# Patient Record
Sex: Male | Born: 1975 | Race: Black or African American | Hispanic: No | Marital: Single | Smoking: Current every day smoker
Health system: Southern US, Community
[De-identification: ages and names within clinical notes are randomized; demographics above are authoritative.]

---

## 1998-02-11 ENCOUNTER — Emergency Department (HOSPITAL_COMMUNITY): Admission: EM | Admit: 1998-02-11 | Discharge: 1998-02-11 | Payer: Self-pay | Admitting: Emergency Medicine

## 1999-02-08 ENCOUNTER — Emergency Department (HOSPITAL_COMMUNITY): Admission: EM | Admit: 1999-02-08 | Discharge: 1999-02-08 | Payer: Self-pay

## 1999-06-17 ENCOUNTER — Emergency Department (HOSPITAL_COMMUNITY): Admission: EM | Admit: 1999-06-17 | Discharge: 1999-06-17 | Payer: Self-pay | Admitting: Emergency Medicine

## 1999-06-25 ENCOUNTER — Emergency Department (HOSPITAL_COMMUNITY): Admission: EM | Admit: 1999-06-25 | Discharge: 1999-06-25 | Payer: Self-pay | Admitting: Emergency Medicine

## 1999-08-21 ENCOUNTER — Emergency Department (HOSPITAL_COMMUNITY): Admission: EM | Admit: 1999-08-21 | Discharge: 1999-08-21 | Payer: Self-pay | Admitting: Emergency Medicine

## 1999-10-10 ENCOUNTER — Emergency Department (HOSPITAL_COMMUNITY): Admission: EM | Admit: 1999-10-10 | Discharge: 1999-10-10 | Payer: Self-pay | Admitting: Emergency Medicine

## 2000-09-10 ENCOUNTER — Emergency Department (HOSPITAL_COMMUNITY): Admission: EM | Admit: 2000-09-10 | Discharge: 2000-09-11 | Payer: Self-pay | Admitting: Emergency Medicine

## 2001-02-14 ENCOUNTER — Emergency Department (HOSPITAL_COMMUNITY): Admission: EM | Admit: 2001-02-14 | Discharge: 2001-02-14 | Payer: Self-pay | Admitting: Emergency Medicine

## 2003-01-19 ENCOUNTER — Emergency Department (HOSPITAL_COMMUNITY): Admission: EM | Admit: 2003-01-19 | Discharge: 2003-01-20 | Payer: Self-pay | Admitting: Emergency Medicine

## 2003-01-20 ENCOUNTER — Encounter: Payer: Self-pay | Admitting: Emergency Medicine

## 2005-08-10 ENCOUNTER — Emergency Department (HOSPITAL_COMMUNITY): Admission: EM | Admit: 2005-08-10 | Discharge: 2005-08-10 | Payer: Self-pay | Admitting: Emergency Medicine

## 2006-03-18 ENCOUNTER — Emergency Department (HOSPITAL_COMMUNITY): Admission: EM | Admit: 2006-03-18 | Discharge: 2006-03-18 | Payer: Self-pay | Admitting: Emergency Medicine

## 2007-02-22 ENCOUNTER — Emergency Department (HOSPITAL_COMMUNITY): Admission: EM | Admit: 2007-02-22 | Discharge: 2007-02-22 | Payer: Self-pay | Admitting: Emergency Medicine

## 2007-03-10 ENCOUNTER — Emergency Department (HOSPITAL_COMMUNITY): Admission: EM | Admit: 2007-03-10 | Discharge: 2007-03-10 | Payer: Self-pay | Admitting: Emergency Medicine

## 2007-10-15 ENCOUNTER — Emergency Department (HOSPITAL_COMMUNITY): Admission: EM | Admit: 2007-10-15 | Discharge: 2007-10-15 | Payer: Self-pay | Admitting: Emergency Medicine

## 2008-05-16 ENCOUNTER — Emergency Department (HOSPITAL_COMMUNITY): Admission: EM | Admit: 2008-05-16 | Discharge: 2008-05-16 | Payer: Self-pay | Admitting: Emergency Medicine

## 2008-07-07 ENCOUNTER — Emergency Department (HOSPITAL_COMMUNITY): Admission: EM | Admit: 2008-07-07 | Discharge: 2008-07-07 | Payer: Self-pay | Admitting: Emergency Medicine

## 2008-08-15 ENCOUNTER — Emergency Department (HOSPITAL_COMMUNITY): Admission: EM | Admit: 2008-08-15 | Discharge: 2008-08-15 | Payer: Self-pay | Admitting: Emergency Medicine

## 2010-05-25 ENCOUNTER — Emergency Department (HOSPITAL_COMMUNITY): Admission: EM | Admit: 2010-05-25 | Discharge: 2010-05-25 | Payer: Self-pay | Admitting: Emergency Medicine

## 2010-05-31 ENCOUNTER — Emergency Department (HOSPITAL_COMMUNITY): Admission: EM | Admit: 2010-05-31 | Discharge: 2010-05-31 | Payer: Self-pay | Admitting: Emergency Medicine

## 2010-08-14 ENCOUNTER — Emergency Department (HOSPITAL_COMMUNITY)
Admission: EM | Admit: 2010-08-14 | Discharge: 2010-08-14 | Payer: Self-pay | Source: Home / Self Care | Admitting: Emergency Medicine

## 2010-11-15 ENCOUNTER — Emergency Department (HOSPITAL_COMMUNITY)
Admission: EM | Admit: 2010-11-15 | Discharge: 2010-11-15 | Disposition: A | Discharge status: Home / Self Care | Payer: Self-pay | Attending: Emergency Medicine | Admitting: Emergency Medicine

## 2010-11-15 ENCOUNTER — Emergency Department (HOSPITAL_COMMUNITY): Payer: Self-pay

## 2010-11-15 DIAGNOSIS — M25539 Pain in unspecified wrist: Secondary | ICD-10-CM | POA: Insufficient documentation

## 2011-11-25 ENCOUNTER — Emergency Department (HOSPITAL_COMMUNITY)
Admission: EM | Admit: 2011-11-25 | Discharge: 2011-11-25 | Disposition: A | Discharge status: Home / Self Care | Payer: Self-pay | Attending: Emergency Medicine | Admitting: Emergency Medicine

## 2011-11-25 ENCOUNTER — Encounter (HOSPITAL_COMMUNITY): Payer: Self-pay | Admitting: Emergency Medicine

## 2011-11-25 DIAGNOSIS — F17200 Nicotine dependence, unspecified, uncomplicated: Secondary | ICD-10-CM | POA: Insufficient documentation

## 2011-11-25 DIAGNOSIS — K029 Dental caries, unspecified: Secondary | ICD-10-CM | POA: Insufficient documentation

## 2011-11-25 MED ORDER — HYDROCODONE-ACETAMINOPHEN 5-325 MG PO TABS: 1.0000 | ORAL_TABLET | Freq: Four times a day (QID) | ORAL | Status: AC | PRN

## 2011-11-25 MED ORDER — NAPROXEN 500 MG PO TABS: 500.0000 mg | ORAL_TABLET | Freq: Two times a day (BID) | ORAL | Status: DC

## 2011-11-25 MED ORDER — PENICILLIN V POTASSIUM 500 MG PO TABS: 500.0000 mg | ORAL_TABLET | Freq: Three times a day (TID) | ORAL | Status: AC

## 2011-11-25 NOTE — ED Notes (Signed)
Onset one week ago dental pain worsening of over time. Pain currently 8/10 throbbing. Ax4

## 2011-11-25 NOTE — ED Provider Notes (Signed)
History     CSN: [id number]  Arrival date & time 11/25/11  1359   First MD Initiated Contact with Patient 11/25/11 1623      Chief Complaint  Patient presents with  . Dental Pain    (Consider location/radiation/quality/duration/timing/severity/associated sxs/prior treatment) Patient is a 36 y.o. male presenting with tooth pain. The history is provided by the patient.  Dental PainThe primary symptoms include mouth pain. Primary symptoms do not include dental injury, fever, shortness of breath or sore throat. Episode onset: Several days ago. The symptoms are worsening.  Additional symptoms do not include: purulent gums, trismus, facial swelling and drooling.    History reviewed. No pertinent past medical history.  History reviewed. No pertinent past surgical history.  No family history on file.  History  Substance Use Topics  . Smoking status: Current Some Day Smoker  . Smokeless tobacco: Not on file  . Alcohol Use: No      Review of Systems  Constitutional: Negative for fever.  HENT: Negative for sore throat, facial swelling and drooling.   Respiratory: Negative for shortness of breath.   All other systems reviewed and are negative.    Allergies  Review of patient's allergies indicates no known allergies.  Home Medications  No current outpatient prescriptions on file.  BP 116/90  Pulse 80  Temp(Src) 98.5 F (36.9 C) (Oral)  Resp 16  SpO2 99%  Physical Exam  Nursing note and vitals reviewed. Constitutional: He appears well-developed and well-nourished. No distress.  HENT:  Head: Normocephalic and atraumatic. No trismus in the jaw.  Right Ear: External ear normal.  Left Ear: External ear normal.  Mouth/Throat: Abnormal dentition. Dental caries present. No dental abscesses or uvula swelling.  Eyes: Conjunctivae are normal. Right eye exhibits no discharge. Left eye exhibits no discharge. No scleral icterus.  Neck: Neck supple. No tracheal deviation present.   Cardiovascular: Normal rate.   Pulmonary/Chest: Effort normal. No stridor. No respiratory distress.  Musculoskeletal: He exhibits no edema.  Neurological: He is alert. Cranial nerve deficit: no gross deficits.  Skin: Skin is warm and dry. No rash noted.  Psychiatric: He has a normal mood and affect.    ED Course  Procedures (including critical care time)  Labs Reviewed - No data to display No results found.   1. Dental caries       MDM  Symptoms are consistent with a dental caries possible tooth infection. There are no obvious signs of a severe abscess. He has no significant facial swelling. At this time there does not appear to be any evidence of an emergency medical condition. I will refer him to a dentist/oral surgeon. Patient will be given prescriptions  for pain medications and antibiotics.         Celene Kras, MD 11/25/11 [phone]

## 2011-11-25 NOTE — Discharge Instructions (Signed)
Dental Care and Dentist Visits   Dental care supports good overall health. Regular dental visits can also help you avoid dental pain, bleeding, infection, and other more serious health problems in the future. It is important to keep the mouth healthy because diseases in the teeth, gums, and other oral tissues can spread to other areas of the body. Some problems, such as diabetes, heart disease, and pre-term labor have been associated with poor oral health.   See your dentist every 6 months. If you experience emergency problems such as a toothache or broken tooth, go to the dentist right away. If you see your dentist regularly, you may catch problems early. It is easier to be treated for problems in the early stages.   WHAT TO EXPECT AT A DENTIST VISIT   Your dentist will look for many common oral health problems and recommend proper treatment. At your regular dental visit, you can expect:   Gentle cleaning of the teeth and gums. This includes scraping and polishing. This helps to remove the sticky substance around the teeth and gums (plaque). Plaque forms in the mouth shortly after eating. Over time, plaque hardens on the teeth as tartar. If tartar is not removed regularly, it can cause problems. Cleaning also helps remove stains.   Periodic X-rays. These pictures of the teeth and supporting bone will help your dentist assess the health of your teeth.   Periodic fluoride treatments. Fluoride is a natural mineral shown to help strengthen teeth. Fluoride treatment involves applying a fluoride gel or varnish to the teeth. It is most commonly done in children.   Examination of the mouth, tongue, jaws, teeth, and gums to look for any oral health problems, such as:   Cavities (dental caries). This is decay on the tooth caused by plaque, sugar, and acid in the mouth. It is best to catch a cavity when it is small.   Inflammation of the gums caused by plaque buildup (gingivitis).   Problems with the mouth or malformed or  misaligned teeth.   Oral cancer or other diseases of the soft tissues or jaws.   KEEP YOUR TEETH AND GUMS HEALTHY   For healthy teeth and gums, follow these general guidelines as well as your dentist's specific advice:   Have your teeth professionally cleaned at the dentist every 6 months.   Brush twice daily with a fluoride toothpaste.   Floss your teeth daily.   Ask your dentist if you need fluoride supplements, treatments, or fluoride toothpaste.   Eat a healthy diet. Reduce foods and drinks with added sugar.   Avoid smoking.  TREATMENT FOR ORAL HEALTH PROBLEMS   If you have oral health problems, treatment varies depending on the conditions present in your teeth and gums.   Your caregiver will most likely recommend good oral hygiene at each visit.   For cavities, gingivitis, or other oral health disease, your caregiver will perform a procedure to treat the problem. This is typically done at a separate appointment. Sometimes your caregiver will refer you to another dental specialist for specific tooth problems or for surgery.  SEEK IMMEDIATE DENTAL CARE IF:   You have pain, bleeding, or soreness in the gum, tooth, jaw, or mouth area.   A permanent tooth becomes loose or separated from the gum socket.   You experience a blow or injury to the mouth or jaw area.  Document Released: 05/15/2011 Document Revised: 08/22/2011 Document Reviewed: 05/15/2011   [employer] Patient Information 2012 [employer].

## 2012-02-07 ENCOUNTER — Encounter (HOSPITAL_COMMUNITY): Payer: Self-pay | Admitting: *Deleted

## 2012-02-07 ENCOUNTER — Emergency Department (HOSPITAL_COMMUNITY)
Admission: EM | Admit: 2012-02-07 | Discharge: 2012-02-07 | Disposition: A | Discharge status: Home Health | Payer: Self-pay | Attending: Emergency Medicine | Admitting: Emergency Medicine

## 2012-02-07 DIAGNOSIS — K029 Dental caries, unspecified: Secondary | ICD-10-CM | POA: Insufficient documentation

## 2012-02-07 DIAGNOSIS — F17200 Nicotine dependence, unspecified, uncomplicated: Secondary | ICD-10-CM | POA: Insufficient documentation

## 2012-02-07 DIAGNOSIS — K0889 Other specified disorders of teeth and supporting structures: Secondary | ICD-10-CM

## 2012-02-07 MED ORDER — PENICILLIN V POTASSIUM 500 MG PO TABS: 500.0000 mg | ORAL_TABLET | Freq: Three times a day (TID) | ORAL | Status: AC

## 2012-02-07 MED ORDER — IBUPROFEN 800 MG PO TABS: 800.0000 mg | ORAL_TABLET | Freq: Three times a day (TID) | ORAL | Status: AC

## 2012-02-07 MED ORDER — ACETAMINOPHEN-CODEINE #3 300-30 MG PO TABS
1.0000 | ORAL_TABLET | Freq: Four times a day (QID) | ORAL | Status: AC | PRN
Start: 2012-02-07 — End: 2012-02-17

## 2012-02-07 NOTE — ED Notes (Signed)
Patient complains of pain in the upper left back side of his mouth x 1 week.  He has noted broken teeth in area he complains of pain.

## 2012-02-07 NOTE — ED Provider Notes (Signed)
History     CSN: [id number]  Arrival date & time 02/07/12  0744   First MD Initiated Contact with Patient 02/07/12 0805      Chief Complaint  Patient presents with  . Dental Pain    (Consider location/radiation/quality/duration/timing/severity/associated sxs/prior treatment) Patient is a 36 y.o. male presenting with tooth pain. The history is provided by the patient.  Dental Pain  Patient presents to the ER complaining of a 1 week hx or recurrent left upper dental decay associated with massive dental decay. Patient states he was seen in ER a few months ago for similar pain and took medications with relief of pain but did not follow up with dentist for "financial reasons." he denies fevers, chills, HA, facial swelling, difficulty breathing or swallowing. States pain is aggravated by chewing and cold air. Has not taken anything for pain PTA.   History reviewed. No pertinent past medical history.  History reviewed. No pertinent past surgical history.  No family history on file.  History  Substance Use Topics  . Smoking status: Current Everyday Smoker  . Smokeless tobacco: Not on file  . Alcohol Use: No      Review of Systems  All other systems reviewed and are negative.    Allergies  Review of patient's allergies indicates no known allergies.  Home Medications   Current Outpatient Rx  Name Route Sig Dispense Refill  . IBUPROFEN 200 MG PO TABS Oral Take 600 mg by mouth every 6 (six) hours as needed. For pain    . ACETAMINOPHEN-CODEINE #3 300-30 MG PO TABS Oral Take 1-2 tablets by mouth every 6 (six) hours as needed for pain. 15 tablet 0  . IBUPROFEN 800 MG PO TABS Oral Take 1 tablet (800 mg total) by mouth 3 (three) times daily. Take 800mg  by mouth at breakfast, lunch and dinner for the next 5 days 21 tablet 0  . PENICILLIN V POTASSIUM 500 MG PO TABS Oral Take 1 tablet (500 mg total) by mouth 3 (three) times daily. 30 tablet 0    BP 134/97  Pulse 88  Temp(Src) 97.7  F (36.5 C) (Oral)  Resp 18  SpO2 100%  Physical Exam  Nursing note and vitals reviewed. Constitutional: He is oriented to person, place, and time. He appears well-developed and well-nourished. No distress.  HENT:  Head: Normocephalic and atraumatic.  Right Ear: External ear normal.  Left Ear: External ear normal.  Mouth/Throat: Oropharynx is clear and moist.       Patent airway without uvula, lingual or sublingual edema.   Massive dental decay of left upper molar with TTP and gingivitis but no gingival mass or fluctuance.   Eyes: Conjunctivae are normal.  Neck: Normal range of motion. Neck supple.  Cardiovascular: Normal rate.   Pulmonary/Chest: Effort normal.  Musculoskeletal: Normal range of motion.  Lymphadenopathy:    He has no cervical adenopathy.  Neurological: He is alert and oriented to person, place, and time.  Skin: Skin is warm and dry. No rash noted. He is not diaphoretic. No erythema.  Psychiatric: He has a normal mood and affect.    ED Course  Procedures (including critical care time)  Labs Reviewed - No data to display No results found.   1. Dental decay   2. Pain, dental       MDM  Left upper dental decay with associated dental pain but no signs or symptoms of dental abscess. Patent airway. Afebrile, non toxic appearing.   Dental referral given.  [place], [place] 02/07/12 [phone]

## 2012-02-07 NOTE — ED Provider Notes (Signed)
Medical screening examination/treatment/procedure(s) were performed by non-physician practitioner and as supervising physician I was immediately available for consultation/collaboration.   Carleene Cooper III, MD 02/07/12 2035

## 2012-02-07 NOTE — Discharge Instructions (Signed)
Apply warm compresses to jaw throughout the day. Take antibiotic and completion. Alternate between ibuprofen and tylenol#3 as directed, as needed for pain but do not drive or operate machinery with tylenol#3 use. Followup with a dentist is very important for ongoing evaluation and management of recurrent dental pain. Call office today and explain that you are being referred from ER for dental pain and decay follow up. However return to emergency department for emergent changing or worsening symptoms.   Dental Caries  Tooth decay (dental caries, cavities) is the most common of all oral diseases. It occurs in all ages but is more common in children and young adults.  CAUSES  Bacteria in your mouth combine with foods (particularly sugars and starches) to produce plaque. Plaque is a substance that sticks to the hard surfaces of teeth. The bacteria in the plaque produce acids that attack the enamel of teeth. Repeated acid attacks dissolve the enamel and create holes in the teeth. Root surfaces of teeth may also get these holes.  Other contributing factors include:   Frequent snacking and drinking of cavity-producing foods and liquids.   Poor oral hygiene.   Dry mouth.   Substance abuse such as methamphetamine.   Broken or poor fitting dental restorations.   Eating disorders.   Gastroesophageal reflux disease (GERD).   Certain radiation treatments to the head and neck.  SYMPTOMS  At first, dental decay appears as white, chalky areas on the enamel. In this early stage, symptoms are seldom present. As the decay progresses, pits and holes may appear on the enamel surfaces. Progression of the decay will lead to softening of the hard layers of the tooth. At this point you may experience some pain or achy feeling after sweet, hot, or cold foods or drinks are consumed. If left untreated, the decay will reach the internal structures of the tooth and produce severe pain. Extensive dental treatment, such as  root canal therapy, may be needed to save the tooth at this late stage of decay development.  DIAGNOSIS  Most cavities will be detected during regular check-ups. A thorough medical and dental history will be taken by the dentist. The dentist will use instruments to check the surfaces of your teeth for any breakdown or discoloration. Some dentists have special instruments, such as lasers, that detect tooth decay. Dental X-rays may also show some cavities that are not visible to the eye (such as between the contact areas of the teeth). TREATMENT  Treatment involves removal of the tooth decay and replacement with a restorative material such as silver, gold, or composite (white) material. However, if the decay involves a large area of the tooth and there is little remaining healthy tooth structure, a cap (crown) will be fitted over the remaining structure. If the decay involves the center part of the tooth (pulp), root canal treatment will be needed before any type of dental restoration is placed. If the tooth is severely destroyed by the decay process, leaving the remaining tooth structures unrestorable, the tooth will need to be pulled (extracted). Some early tooth decay may be reversed by fluoride treatments and thorough brushing and flossing at home. PREVENTION   Eat healthy foods. Restrict the amount of sugary, starchy foods and liquids you consume. Avoid frequent snacking and drinking of unhealthy foods and liquids.   Sealants can help with prevention of cavities. Sealants are composite resins applied onto the biting surfaces of teeth at risk for decay. They smooth out the pits and grooves and prevent food  from being trapped in them. This is done in early childhood before tooth decay has started.   Fluoride tablets may also be prescribed to children between 6 months and 69 years of age if your drinking water is not fluoridated. The fluoride absorbed by the tooth enamel makes teeth less susceptible to  decay. Thorough daily cleaning with a toothbrush and dental floss is the best way to prevent cavities. Use of a fluoride toothpaste is highly recommended. Fluoride mouth rinses may be used in specific cases.   Topical application of fluoride by your dentist is important in children.   Regular visits with a dentist for checkups and cleanings are also important.  SEEK IMMEDIATE DENTAL CARE IF:  You have a fever.   You develop redness and swelling of your face, jaw, or neck.   You develop swelling around a tooth.   You are unable to open your mouth or cannot swallow.   You have severe pain uncontrolled by pain medicine.  Document Released: 05/25/2002 Document Revised: 08/22/2011 Document Reviewed: 02/07/2011 [facility] Patient Information 2012 [place], [place].

## 2012-06-16 ENCOUNTER — Encounter (HOSPITAL_COMMUNITY): Payer: Self-pay | Admitting: *Deleted

## 2012-06-16 ENCOUNTER — Emergency Department (HOSPITAL_COMMUNITY)
Admission: EM | Admit: 2012-06-16 | Discharge: 2012-06-16 | Disposition: A | Discharge status: Home / Self Care | Payer: Self-pay | Attending: Emergency Medicine | Admitting: Emergency Medicine

## 2012-06-16 DIAGNOSIS — F17200 Nicotine dependence, unspecified, uncomplicated: Secondary | ICD-10-CM | POA: Insufficient documentation

## 2012-06-16 DIAGNOSIS — K0889 Other specified disorders of teeth and supporting structures: Secondary | ICD-10-CM

## 2012-06-16 DIAGNOSIS — K089 Disorder of teeth and supporting structures, unspecified: Secondary | ICD-10-CM | POA: Insufficient documentation

## 2012-06-16 MED ORDER — PENICILLIN V POTASSIUM 250 MG PO TABS: 250.0000 mg | ORAL_TABLET | Freq: Four times a day (QID) | ORAL | Status: AC

## 2012-06-16 MED ORDER — HYDROCODONE-ACETAMINOPHEN 5-325 MG PO TABS
1.0000 | ORAL_TABLET | Freq: Once | ORAL | Status: AC
  Administered 2012-06-16: 1 via ORAL
  Filled 2012-06-16: qty 1

## 2012-06-16 MED ORDER — HYDROCODONE-ACETAMINOPHEN 5-325 MG PO TABS: 1.0000 | ORAL_TABLET | ORAL | Status: DC | PRN

## 2012-06-16 NOTE — ED Notes (Signed)
Patient presents with a cracked left lower molar. States this has been on going for the last three days. He has only been taking oralgel and tylenol. He has not seen a dentist. Denies fevers or chills.

## 2012-06-16 NOTE — ED Provider Notes (Signed)
History  Scribed for No att. providers found, the patient was seen in room TR07C/TR07C. This chart was scribed by Candelaria Stagers. The patient's care started at 8:05 PM   CSN: [id number]  Arrival date & time 06/16/12  1759   First MD Initiated Contact with Patient 06/16/12 1815      Chief Complaint  Patient presents with  . Dental Pain     The history is provided by the patient. No language interpreter was used.   Blake Bishop is a 36 y.o. male who presents to the Emergency Department complaining of recurrent left lower tooth pain that became worse three days ago.  Pt denies fever or chills.  Nothing seems to make the sx better or worse.  Pt states that he is going to have the tooth pulled in three days.     History reviewed. No pertinent past medical history.  History reviewed. No pertinent past surgical history.  No family history on file.  History  Substance Use Topics  . Smoking status: Current Every Day Smoker  . Smokeless tobacco: Not on file  . Alcohol Use: No      Review of Systems  Constitutional: Negative for fever and chills.  HENT: Positive for dental problem.   All other systems reviewed and are negative.    Allergies  Review of patient's allergies indicates no known allergies.  Home Medications   Current Outpatient Rx  Name Route Sig Dispense Refill  . IBUPROFEN 200 MG PO TABS Oral Take 600 mg by mouth every 6 (six) hours as needed. For pain    . HYDROCODONE-ACETAMINOPHEN 5-325 MG PO TABS Oral Take 1 tablet by mouth every 4 (four) hours as needed for pain. 15 tablet 0  . PENICILLIN V POTASSIUM 250 MG PO TABS Oral Take 1 tablet (250 mg total) by mouth 4 (four) times daily. 40 tablet 0    BP 120/95  Pulse 100  Temp 97.4 F (36.3 C) (Oral)  Resp 18  SpO2 98%  Physical Exam  Nursing note and vitals reviewed. Constitutional: He is oriented to person, place, and time. He appears well-developed and well-nourished. No distress.  HENT:  Head:  Normocephalic and atraumatic.       First molar on the left lower side is fractured.  Tender to pressure.  No abscesses.  Multiple other fractured teeth.   Eyes: EOM are normal. Pupils are equal, round, and reactive to light.  Neck: Neck supple. No tracheal deviation present.  Pulmonary/Chest: Effort normal. No respiratory distress.  Musculoskeletal: Normal range of motion. He exhibits no edema.  Neurological: He is alert and oriented to person, place, and time.  Skin: Skin is warm and dry.  Psychiatric: He has a normal mood and affect. His behavior is normal.    ED Course  Procedures   DIAGNOSTIC STUDIES: Oxygen Saturation is 98% on room air, normal by my interpretation.    COORDINATION OF CARE:    Labs Reviewed - No data to display No results found.   1. Pain, dental       MDM  I personally performed the services described in this documentation, which was scribed in my presence. The recorded information has been reviewed and considered.        Loren Racer, MD 06/16/12 2005

## 2012-06-16 NOTE — ED Notes (Signed)
The pt has had a toothache for 3 days 

## 2012-11-13 ENCOUNTER — Emergency Department (HOSPITAL_COMMUNITY)
Admission: EM | Admit: 2012-11-13 | Discharge: 2012-11-13 | Disposition: A | Discharge status: Home / Self Care | Payer: Self-pay | Attending: Emergency Medicine | Admitting: Emergency Medicine

## 2012-11-13 ENCOUNTER — Encounter (HOSPITAL_COMMUNITY): Payer: Self-pay | Admitting: Emergency Medicine

## 2012-11-13 DIAGNOSIS — K089 Disorder of teeth and supporting structures, unspecified: Secondary | ICD-10-CM | POA: Insufficient documentation

## 2012-11-13 DIAGNOSIS — F17200 Nicotine dependence, unspecified, uncomplicated: Secondary | ICD-10-CM | POA: Insufficient documentation

## 2012-11-13 DIAGNOSIS — K0889 Other specified disorders of teeth and supporting structures: Secondary | ICD-10-CM

## 2012-11-13 MED ORDER — PENICILLIN V POTASSIUM 500 MG PO TABS: 500.0000 mg | ORAL_TABLET | Freq: Three times a day (TID) | ORAL | Status: DC

## 2012-11-13 MED ORDER — OXYCODONE-ACETAMINOPHEN 5-325 MG PO TABS
2.0000 | ORAL_TABLET | Freq: Once | ORAL | Status: AC
  Administered 2012-11-13: 2 via ORAL
  Filled 2012-11-13: qty 2

## 2012-11-13 MED ORDER — HYDROCODONE-ACETAMINOPHEN 5-325 MG PO TABS: 2.0000 | ORAL_TABLET | Freq: Four times a day (QID) | ORAL | Status: DC | PRN

## 2012-11-13 NOTE — ED Notes (Signed)
Left tooth pain x 1 week. Mild swelling noted. States has had a broken tooth "for a while".

## 2012-11-13 NOTE — ED Provider Notes (Signed)
History    This chart was scribed for non-physician practitioner Roxy Horseman, PA-C working with Nelia Shi, MD by Toya Smothers, ED Scribe. This patient was seen in room TR04C/TR04C and the patient's care was started at 15:51.  CSN: [id number]  Arrival date & time 11/13/12  1518   First MD Initiated Contact with Patient 11/13/12 1528      No chief complaint on file.  The history is provided by the patient. No language interpreter was used.    Blake Bishop is a 37 y.o. male who presents to the Emergency Department complaining of 1 week of gradual onset, progressive, moderate, left maxillary dental pain without injury. Pain is aching, worse with pressure, and alleviated by nothing. Pt reports that he was unable to see his Dentist today, though he intends to follow up Monday. Symptoms have not been treated PTA. No fever, chills, difficulty swallowing, or difficulty breathing. Pt is a current everyday smoker, denying alcohol and illicit drug use. No pertinent medical or surgical Hx denoted.   History reviewed. No pertinent past medical history.  History reviewed. No pertinent past surgical history.  No family history on file.  History  Substance Use Topics  . Smoking status: Current Every Day Smoker  . Smokeless tobacco: Not on file  . Alcohol Use: No      Review of Systems  HENT: Positive for dental problem.   All other systems reviewed and are negative.    Allergies  Review of patient's allergies indicates no known allergies.  Home Medications   Current Outpatient Rx  Name  Route  Sig  Dispense  Refill  . HYDROcodone-acetaminophen (NORCO/VICODIN) 5-325 MG per tablet   Oral   Take 2 tablets by mouth every 6 (six) hours as needed for pain.   15 tablet   0   . penicillin v potassium (VEETID) 500 MG tablet   Oral   Take 1 tablet (500 mg total) by mouth 3 (three) times daily.   30 tablet   0     BP 151/102  Pulse 92  Temp(Src) 98.5 F (36.9 C) (Oral)   SpO2 99%  Physical Exam  Nursing note and vitals reviewed. Constitutional: He is oriented to person, place, and time. He appears well-developed and well-nourished. No distress.  HENT:  Head: Atraumatic. Macrocephalic.  Mouth/Throat: Lacerations present.    Poor dentition throughout. Top left 3 molars are broken and decayed. No signs of gingival swelling. No signs of peritonsillar or tonsillar swelling. Uvula is midline. No ludwig angular.   Eyes: EOM are normal.  Neck: Neck supple. No tracheal deviation present.  Cardiovascular: Normal rate.   Pulmonary/Chest: Effort normal. No respiratory distress.  Musculoskeletal: Normal range of motion.  Neurological: He is alert and oriented to person, place, and time.  Skin: Skin is warm and dry.  Psychiatric: He has a normal mood and affect. His behavior is normal.    ED Course  Dental Date/Time: 11/13/2012 2:00 PM Performed by: Roxy Horseman Authorized by: Roxy Horseman Consent: Verbal consent obtained. Risks and benefits: risks, benefits and alternatives were discussed Consent given by: patient Patient understanding: patient states understanding of the procedure being performed Patient consent: the patient's understanding of the procedure matches consent given Patient identity confirmed: verbally with patient Local anesthesia used: yes Local anesthetic: bupivacaine 0.5% with epinephrine Patient sedated: no Patient tolerance: Patient tolerated the procedure well with no immediate complications. Comments: Alveolar block   DIAGNOSTIC STUDIES: Oxygen Saturation is 99% on room air, normal  by my interpretation.    COORDINATION OF CARE: 15:51- Evaluated Pt. Pt is awake, alert, and without distress. 15:52- Preformed Alveolar Block.  15:58- Patient informed of clinical course, understand medical decision-making process, and agree with plan.  16:00- Ordered oxyCODONE-acetaminophen (PERCOCET/ROXICET) 5-325 MG per tablet 2 tablet  Once. Plan: Home Medications- penicillin v potassium (VEETID) 500 MG tablet  3 times daily and HYDROcodone-acetaminophen (NORCO/VICODIN) 5-325 MG per tablet every  Six hours PRN. ; Recommended follow up- Dentist     Labs Reviewed - No data to display No results found.   1. Pain, dental       MDM  37 year old male with uncomplicated dental pain.  Patient treated with dental block in the ED.  Will discharge with dental follow up, pain meds, and abx.     I personally performed the services described in this documentation, which was scribed in my presence. The recorded information has been reviewed and is accurate.     Roxy Horseman, PA-C 11/13/12 1622

## 2012-11-15 NOTE — ED Provider Notes (Signed)
Medical screening examination/treatment/procedure(s) were performed by non-physician practitioner and as supervising physician I was immediately available for consultation/collaboration.    Nelia Shi, MD 11/15/12 [phone]

## 2012-11-27 ENCOUNTER — Encounter (HOSPITAL_COMMUNITY): Payer: Self-pay | Admitting: Emergency Medicine

## 2012-11-27 ENCOUNTER — Emergency Department (HOSPITAL_COMMUNITY)
Admission: EM | Admit: 2012-11-27 | Discharge: 2012-11-27 | Disposition: A | Discharge status: Home / Self Care | Payer: Self-pay | Attending: Emergency Medicine | Admitting: Emergency Medicine

## 2012-11-27 DIAGNOSIS — K0889 Other specified disorders of teeth and supporting structures: Secondary | ICD-10-CM

## 2012-11-27 DIAGNOSIS — K089 Disorder of teeth and supporting structures, unspecified: Secondary | ICD-10-CM | POA: Insufficient documentation

## 2012-11-27 DIAGNOSIS — F17200 Nicotine dependence, unspecified, uncomplicated: Secondary | ICD-10-CM | POA: Insufficient documentation

## 2012-11-27 MED ORDER — HYDROCODONE-ACETAMINOPHEN 5-325 MG PO TABS: 2.0000 | ORAL_TABLET | Freq: Four times a day (QID) | ORAL | Status: DC | PRN

## 2012-11-27 MED ORDER — PENICILLIN V POTASSIUM 500 MG PO TABS: 500.0000 mg | ORAL_TABLET | Freq: Four times a day (QID) | ORAL | Status: AC

## 2012-11-27 NOTE — ED Notes (Signed)
AIDET performed. 

## 2012-11-27 NOTE — ED Provider Notes (Signed)
History     CSN: [id number]  Arrival date & time 11/27/12  4098   First MD Initiated Contact with Patient 11/27/12 [phone]      Chief Complaint  Patient presents with  . Dental Pain    (Consider location/radiation/quality/duration/timing/severity/associated sxs/prior treatment) HPI Comments: Patient presenting with dental pain.  He does not have a dentist.  He has taken Tylenol for the pain without relief.  Patient is a 37 y.o. male presenting with tooth pain. The history is provided by the patient.  Dental PainThe primary symptoms include mouth pain. Primary symptoms do not include dental injury or fever. The symptoms began 2 days ago. The symptoms are worsening. The symptoms are new. The symptoms occur constantly.  Additional symptoms include: gum swelling and gum tenderness. Additional symptoms do not include: dental sensitivity to temperature, purulent gums, trismus, facial swelling, trouble swallowing, pain with swallowing and drooling.    History reviewed. No pertinent past medical history.  History reviewed. No pertinent past surgical history.  No family history on file.  History  Substance Use Topics  . Smoking status: Current Every Day Smoker -- 0.25 packs/day    Types: Cigarettes  . Smokeless tobacco: Not on file  . Alcohol Use: No      Review of Systems  Constitutional: Negative for fever and chills.  HENT: Positive for dental problem. Negative for facial swelling, drooling, trouble swallowing, neck pain and neck stiffness.   All other systems reviewed and are negative.    Allergies  Review of patient's allergies indicates no known allergies.  Home Medications   Current Outpatient Rx  Name  Route  Sig  Dispense  Refill  . acetaminophen (TYLENOL) 325 MG tablet   Oral   Take 325-650 mg by mouth every 6 (six) hours as needed for pain.           BP 135/91  Pulse 100  Temp(Src) 97.2 F (36.2 C) (Oral)  Ht 5\' 7"  (1.702 m)  Wt 135 lb (61.236 kg)  BMI  21.14 kg/m2  SpO2 100%  Physical Exam  Nursing note and vitals reviewed. Constitutional: He is oriented to person, place, and time. He appears well-developed and well-nourished. No distress.  HENT:  Head: Normocephalic and atraumatic. No trismus in the jaw.  Mouth/Throat: Uvula is midline, oropharynx is clear and moist and mucous membranes are normal. Abnormal dentition. No dental abscesses or edematous. No oropharyngeal exudate, posterior oropharyngeal edema, posterior oropharyngeal erythema or tonsillar abscesses.  Poor dental hygiene. Pt able to open and close mouth with out difficulty. Airway intact. Uvula midline. Mild gingival swelling with tenderness over affected area, but no fluctuance. No swelling or tenderness of submental and submandibular regions.  Eyes: Conjunctivae and EOM are normal.  Neck: Normal range of motion and full passive range of motion without pain. Neck supple.  Cardiovascular: Normal rate and regular rhythm.   Pulmonary/Chest: Effort normal and breath sounds normal. No stridor. No respiratory distress. He has no wheezes.  Musculoskeletal: Normal range of motion.  Lymphadenopathy:       Head (right side): No submental, no submandibular, no tonsillar, no preauricular and no posterior auricular adenopathy present.       Head (left side): No submental, no submandibular, no tonsillar, no preauricular and no posterior auricular adenopathy present.    He has no cervical adenopathy.  Neurological: He is alert and oriented to person, place, and time.  Skin: Skin is warm and dry. No rash noted. He is not diaphoretic.  ED Course  Procedures (including critical care time)  Labs Reviewed - No data to display No results found.   No diagnosis found.    MDM  Patient with toothache.  No gross abscess.  Exam unconcerning for Ludwig's angina or spread of infection.  Will treat with penicillin and pain medicine.  Urged patient to follow-up with dentist.           Pascal Lux [place], PA-C 11/28/12 1936

## 2012-11-27 NOTE — ED Notes (Signed)
Patient states started having "wisdom tooth pain x 2 days ago".

## 2012-11-29 NOTE — ED Provider Notes (Signed)
Medical screening examination/treatment/procedure(s) were performed by non-physician practitioner and as supervising physician I was immediately available for consultation/collaboration.  Susie Ehresman L Rylie Knierim, MD 11/29/12 0855 

## 2012-12-09 ENCOUNTER — Encounter (HOSPITAL_COMMUNITY): Payer: Self-pay

## 2012-12-09 ENCOUNTER — Emergency Department (HOSPITAL_COMMUNITY)
Admission: EM | Admit: 2012-12-09 | Discharge: 2012-12-09 | Disposition: A | Discharge status: Home / Self Care | Payer: Self-pay | Attending: Emergency Medicine | Admitting: Emergency Medicine

## 2012-12-09 DIAGNOSIS — F17200 Nicotine dependence, unspecified, uncomplicated: Secondary | ICD-10-CM | POA: Insufficient documentation

## 2012-12-09 DIAGNOSIS — K029 Dental caries, unspecified: Secondary | ICD-10-CM | POA: Insufficient documentation

## 2012-12-09 DIAGNOSIS — K047 Periapical abscess without sinus: Secondary | ICD-10-CM | POA: Insufficient documentation

## 2012-12-09 MED ORDER — IBUPROFEN 800 MG PO TABS: 800.0000 mg | ORAL_TABLET | Freq: Three times a day (TID) | ORAL | Status: DC

## 2012-12-09 MED ORDER — HYDROCODONE-ACETAMINOPHEN 7.5-325 MG/15ML PO SOLN: 10.0000 mL | Freq: Three times a day (TID) | ORAL | Status: DC | PRN

## 2012-12-09 MED ORDER — PENICILLIN V POTASSIUM 500 MG PO TABS: 500.0000 mg | ORAL_TABLET | Freq: Four times a day (QID) | ORAL | Status: AC

## 2012-12-09 NOTE — ED Notes (Signed)
Patient present with c/o right lower dental pain and right ear pain x 2 days. States that it's his wisdom tooth. Hurts to chew on the affected side. No jaw pain. No sensitivity to hot or cold.

## 2012-12-09 NOTE — ED Provider Notes (Signed)
History     CSN: [id number]  Arrival date & time 12/09/12  0157   First MD Initiated Contact with Patient 12/09/12 0202      Chief Complaint  Patient presents with  . Dental Pain    (Consider location/radiation/quality/duration/timing/severity/associated sxs/prior treatment) HPI History by patient. Left lower dental pain onset a few days ago. History of same. Does not have a dentist. He is a smoker. No trauma. Pain sharp in quality and moderate to severe. Pain is not radiating. Hurts to chew. No known alleviating factors. No fevers or chills. No nausea vomiting. No facial swelling.  History reviewed. No pertinent past medical history.  History reviewed. No pertinent past surgical history.  No family history on file.  History  Substance Use Topics  . Smoking status: Current Every Day Smoker -- 0.25 packs/day    Types: Cigarettes  . Smokeless tobacco: Never Used  . Alcohol Use: No      Review of Systems  Constitutional: Negative for fever and chills.  HENT: Positive for dental problem. Negative for sore throat, trouble swallowing, neck pain and neck stiffness.   Eyes: Negative for pain.  Respiratory: Negative for shortness of breath.   Cardiovascular: Negative for chest pain.  Gastrointestinal: Negative for abdominal pain.  Genitourinary: Negative for dysuria.  Musculoskeletal: Negative for back pain.  Skin: Negative for rash.  Neurological: Negative for headaches.  All other systems reviewed and are negative.    Allergies  Review of patient's allergies indicates no known allergies.  Home Medications   Current Outpatient Rx  Name  Route  Sig  Dispense  Refill  . acetaminophen (TYLENOL) 325 MG tablet   Oral   Take 325-650 mg by mouth every 6 (six) hours as needed for pain.         Marland Kitchen HYDROcodone-acetaminophen (NORCO/VICODIN) 5-325 MG per tablet   Oral   Take 2 tablets by mouth every 6 (six) hours as needed for pain.   20 tablet   0     BP 109/69   Pulse 85  Temp(Src) 98.1 F (36.7 C) (Oral)  SpO2 100%  Physical Exam  Constitutional: He is oriented to person, place, and time. He appears well-developed and well-nourished.  HENT:  Head: Normocephalic and atraumatic.  No trismus. Left lower third molar dental caries and tenderness to palpation. No gingival swelling or fluctuance. No associated facial swelling or erythema. Uvula is midline.  Eyes: EOM are normal. Pupils are equal, round, and reactive to light.  Neck: Neck supple.  Cardiovascular: Regular rhythm and intact distal pulses.   Pulmonary/Chest: Effort normal. No stridor. No respiratory distress.  Musculoskeletal: Normal range of motion. He exhibits no edema.  Lymphadenopathy:    He has no cervical adenopathy.  Neurological: He is alert and oriented to person, place, and time.  Skin: Skin is warm and dry.    ED Course  Dental Date/Time: 12/09/2012 2:36 AM Performed by: Sunnie Nielsen Authorized by: Sunnie Nielsen Consent: Verbal consent obtained. Risks and benefits: risks, benefits and alternatives were discussed Consent given by: patient Patient understanding: patient states understanding of the procedure being performed Patient consent: the patient's understanding of the procedure matches consent given Procedure consent: procedure consent matches procedure scheduled Required items: required blood products, implants, devices, and special equipment available Patient identity confirmed: verbally with patient Time out: Immediately prior to procedure a "time out" was called to verify the correct patient, procedure, equipment, support staff and site/side marked as required. Preparation: Patient was prepped and draped in  the usual sterile fashion. Local anesthesia used: yes Anesthesia: local infiltration Local anesthetic: bupivacaine 0.5% without epinephrine Anesthetic total: 1.8 ml Patient tolerance: Patient tolerated the procedure well with no immediate  complications. Comments: Left lower third molar dental block using 27-gauge needle - good anesthesia achieved   (including critical care time)    MDM  Dental pain. Dental block.  Dental referral  Prescription for antibiotics and pain medications provided  Dental infection precautions provided and verbalizes understood        Sunnie Nielsen, MD 12/09/12 [phone]

## 2014-07-18 ENCOUNTER — Emergency Department (HOSPITAL_COMMUNITY)
Admission: EM | Admit: 2014-07-18 | Discharge: 2014-07-18 | Disposition: A | Discharge status: Home / Self Care | Payer: Self-pay | Attending: Emergency Medicine | Admitting: Emergency Medicine

## 2014-07-18 ENCOUNTER — Encounter (HOSPITAL_COMMUNITY): Payer: Self-pay | Admitting: Emergency Medicine

## 2014-07-18 DIAGNOSIS — Z791 Long term (current) use of non-steroidal anti-inflammatories (NSAID): Secondary | ICD-10-CM | POA: Insufficient documentation

## 2014-07-18 DIAGNOSIS — M549 Dorsalgia, unspecified: Secondary | ICD-10-CM

## 2014-07-18 DIAGNOSIS — M546 Pain in thoracic spine: Secondary | ICD-10-CM | POA: Insufficient documentation

## 2014-07-18 DIAGNOSIS — Z720 Tobacco use: Secondary | ICD-10-CM | POA: Insufficient documentation

## 2014-07-18 MED ORDER — OXYCODONE-ACETAMINOPHEN 5-325 MG PO TABS
1.0000 | ORAL_TABLET | Freq: Once | ORAL | Status: AC
  Administered 2014-07-18: 1 via ORAL
  Filled 2014-07-18: qty 1

## 2014-07-18 MED ORDER — METHOCARBAMOL 500 MG PO TABS: 500.0000 mg | ORAL_TABLET | Freq: Two times a day (BID) | ORAL | Status: DC

## 2014-07-18 MED ORDER — HYDROCODONE-ACETAMINOPHEN 5-325 MG PO TABS: 1.0000 | ORAL_TABLET | ORAL | Status: DC | PRN

## 2014-07-18 MED ORDER — METHOCARBAMOL 500 MG PO TABS
500.0000 mg | ORAL_TABLET | Freq: Once | ORAL | Status: AC
  Administered 2014-07-18: 500 mg via ORAL
  Filled 2014-07-18: qty 1

## 2014-07-18 NOTE — Discharge Instructions (Signed)
Take the prescribed medication as directed. °Follow-up with your primary care physician. °Return to the ED for new or worsening symptoms. ° °

## 2014-07-18 NOTE — ED Provider Notes (Signed)
CSN: [id number]     Arrival date & time 07/18/14  1421 History  This chart was scribed for non-physician practitioner Sharilyn SitesLisa Liahm Grivas working with Shon Batonourtney F Horton, MD by Littie Deedsichard Sun, ED Scribe. This patient was seen in room WTR7/WTR7 and the patient's care was started at 2:46 PM.        Chief Complaint  Patient presents with  . Back Pain      The history is provided by the patient. No language interpreter was used.    HPI Comments: Blake Bishop is a 38 y.o. male brought in by EMS who presents to the Emergency Department complaining of gradually worsening, constant left-sided back pain that worsened earlier today. He notes having some numbness and tingling in his left arm. Patient has had similar back pain in the past for which he normally takes Ibuprofen; however, he states that he has never had pain this severe before. The pain feels worse when sitting a certain way. Patient has tried taking Goody powder today but without relief; he did not take any Ibuprofen today. He denies any injury or falls. 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 NKDA.  No chest pain or SOB.   History reviewed. No pertinent past medical history. History reviewed. No pertinent past surgical history. No family history on file. History  Substance Use Topics  . Smoking status: Current Every Day Smoker -- 0.25 packs/day    Types: Cigarettes  . Smokeless tobacco: Never Used  . Alcohol Use: No    Review of Systems  Musculoskeletal: Positive for back pain.  All other systems reviewed and are negative.     Allergies  Review of patient's allergies indicates no known allergies.  Home Medications   Prior to Admission medications   Medication Sig Start Date End Date Taking? Authorizing Provider  acetaminophen (TYLENOL) 325 MG tablet Take 325-650 mg by mouth every 6 (six) hours as needed for pain.    Historical Provider, MD   HYDROcodone-acetaminophen (HYCET) 7.5-325 mg/15 ml solution Take 10 mLs by mouth every 8 (eight) hours as needed for pain. 12/09/12   Sunnie NielsenBrian Opitz, MD  HYDROcodone-acetaminophen (NORCO/VICODIN) 5-325 MG per tablet Take 2 tablets by mouth every 6 (six) hours as needed for pain. 11/27/12   Heather Laisure, PA-C  ibuprofen (ADVIL,MOTRIN) 800 MG tablet Take 1 tablet (800 mg total) by mouth 3 (three) times daily. 12/09/12   Sunnie NielsenBrian Opitz, MD   BP 156/99 mmHg  Pulse 86  Temp(Src) 98.1 F (36.7 C) (Oral)  Resp 18  SpO2 100%   Physical Exam  Constitutional: He is oriented to person, place, and time. He appears well-developed and well-nourished. No distress.  HENT:  Head: Normocephalic and atraumatic.  Mouth/Throat: Oropharynx is clear and moist. No oropharyngeal exudate.  Eyes: Pupils are equal, round, and reactive to light.  Neck: Normal range of motion. Neck supple.  Cardiovascular: Normal rate.   Pulmonary/Chest: Effort normal and breath sounds normal. No respiratory distress. He has no wheezes.  Musculoskeletal: He exhibits no edema.       Thoracic back: He exhibits tenderness and pain.       Back:  Left thoracic paraspinal tenderness without deformity; no mid-line tenderness or step-off; full ROM maintained; pain reproducible with palpation and movement of left shoulder; normal strength and sensation of BUE; both arms remain NVI  Neurological: He is alert and oriented to person, place, and time. No cranial nerve deficit.  AAOx3,  answering questions appropriately; equal strength UE and LE bilaterally; CN grossly intact; moves all extremities appropriately without ataxia; no focal neuro deficits or facial asymmetry appreciated  Skin: Skin is warm and dry. No rash noted. He is not diaphoretic.  Psychiatric: He has a normal mood and affect. His behavior is normal.  Nursing note and vitals reviewed.   ED Course  Procedures  DIAGNOSTIC STUDIES: Oxygen Saturation is 100% on room air, normal by  my interpretation.    COORDINATION OF CARE: 2:50 PM-Discussed treatment plan which includes medication with pt at bedside and pt agreed to plan.    Labs Review Labs Reviewed - No data to display  Imaging Review No results found.   EKG Interpretation None      MDM   Final diagnoses:  Back pain, unspecified location   38 year old male with left mid back pain. Patient works at [employer] and routinely lifts heavy objects on a daily basis. On exam, patient has left thoracic paraspinal tenderness without midline tenderness or deformities. Pain is reproducible with palpation and movement of left shoulder.  He notes subjective numbness and paresthesias of his left arm, however neurologic exam is nonfocal. No chest pain or shortness of breath to suggest PE, patient PERC negative.  Suspect muscular strain.  Patient will be discharged home with vicodin and robaxin. Encouraged FU with PCP. Discussed plan with patient, he/she acknowledged understanding and agreed with plan of care.  Return precautions given for new or worsening symptoms.  I personally performed the services described in this documentation, which was scribed in my presence. The recorded information has been reviewed and is accurate.  Garlon HatchetLisa M Nerine Pulse, PA-C 07/18/14 1547

## 2014-07-18 NOTE — ED Notes (Signed)
Per EMS pt comes from home c/o left side back pain that has been going on but gotten worse today. Pt works at [employer] where he [employer]. Pt took Goody powder today with no relief.

## 2014-07-25 NOTE — ED Notes (Signed)
Patient needed a return to work note-per Trixie DredgeEmily Pheonix Clinkscale, PA ok for RN to document "per patient, may return to normal work duties" on original note

## 2014-07-29 ENCOUNTER — Encounter (HOSPITAL_COMMUNITY): Payer: Self-pay | Admitting: Emergency Medicine

## 2014-07-29 ENCOUNTER — Emergency Department (HOSPITAL_COMMUNITY)
Admission: EM | Admit: 2014-07-29 | Discharge: 2014-07-29 | Disposition: A | Discharge status: Home / Self Care | Payer: Self-pay | Attending: Emergency Medicine | Admitting: Emergency Medicine

## 2014-07-29 DIAGNOSIS — M546 Pain in thoracic spine: Secondary | ICD-10-CM | POA: Insufficient documentation

## 2014-07-29 DIAGNOSIS — Z720 Tobacco use: Secondary | ICD-10-CM | POA: Insufficient documentation

## 2014-07-29 DIAGNOSIS — Z79899 Other long term (current) drug therapy: Secondary | ICD-10-CM | POA: Insufficient documentation

## 2014-07-29 MED ORDER — KETOROLAC TROMETHAMINE 60 MG/2ML IM SOLN
60.0000 mg | Freq: Once | INTRAMUSCULAR | Status: AC
  Administered 2014-07-29: 60 mg via INTRAMUSCULAR
  Filled 2014-07-29: qty 2

## 2014-07-29 NOTE — ED Notes (Signed)
Pt states that he was at work this morning and was picking something up when he started having right upper back pain again.  Pt states that he was seen here last week for back pain as well.

## 2014-07-29 NOTE — Discharge Instructions (Signed)
You will need to see the orthopedic doctor, or a medical doctor of your choice for evaluation and treatment of your recurrent back pain.  They mail it to refer you for physical therapy to improve your strength and allow you to do your regular job activities.  In the meantime, use heat on the sore area 3 or 4 times a day, and take ibuprofen 400 mg 3 times a day with meals for pain.     Back Pain, Adult Low back pain is very common. About 1 in 5 people have back pain.The cause of low back pain is rarely dangerous. The pain often gets better over time.About half of people with a sudden onset of back pain feel better in just 2 weeks. About 8 in 10 people feel better by 6 weeks.  CAUSES Some common causes of back pain include:  Strain of the muscles or ligaments supporting the spine.  Wear and tear (degeneration) of the spinal discs.  Arthritis.  Direct injury to the back. DIAGNOSIS Most of the time, the direct cause of low back pain is not known.However, back pain can be treated effectively even when the exact cause of the pain is unknown.Answering your caregiver's questions about your overall health and symptoms is one of the most accurate ways to make sure the cause of your pain is not dangerous. If your caregiver needs more information, he or she may order lab work or imaging tests (X-rays or MRIs).However, even if imaging tests show changes in your back, this usually does not require surgery. HOME CARE INSTRUCTIONS For many people, back pain returns.Since low back pain is rarely dangerous, it is often a condition that people can learn to [facility]on their own.   Remain active. It is stressful on the back to sit or stand in one place. Do not sit, drive, or stand in one place for more than 30 minutes at a time. Take short walks on level surfaces as soon as pain allows.Try to increase the length of time you walk each day.  Do not stay in bed.Resting more than 1 or 2 days can delay your  recovery.  Do not avoid exercise or work.Your body is made to move.It is not dangerous to be active, even though your back may hurt.Your back will likely heal faster if you return to being active before your pain is gone.  Pay attention to your body when you bend and lift. Many people have less discomfortwhen lifting if they bend their knees, keep the load close to their bodies,and avoid twisting. Often, the most comfortable positions are those that put less stress on your recovering back.  Find a comfortable position to sleep. Use a firm mattress and lie on your side with your knees slightly bent. If you lie on your back, put a pillow under your knees.  Only take over-the-counter or prescription medicines as directed by your caregiver. Over-the-counter medicines to reduce pain and inflammation are often the most helpful.Your caregiver may prescribe muscle relaxant drugs.These medicines help dull your pain so you can more quickly return to your normal activities and healthy exercise.  Put ice on the injured area.  Put ice in a plastic bag.  Place a towel between your skin and the bag.  Leave the ice on for 15-20 minutes, 03-04 times a day for the first 2 to 3 days. After that, ice and heat may be alternated to reduce pain and spasms.  Ask your caregiver about trying back exercises and gentle massage.  This may be of some benefit.  Avoid feeling anxious or stressed.Stress increases muscle tension and can worsen back pain.It is important to recognize when you are anxious or stressed and learn ways to manage it.Exercise is a great option. SEEK MEDICAL CARE IF:  You have pain that is not relieved with rest or medicine.  You have pain that does not improve in 1 week.  You have new symptoms.  You are generally not feeling well. SEEK IMMEDIATE MEDICAL CARE IF:   You have pain that radiates from your back into your legs.  You develop new bowel or bladder control problems.  You  have unusual weakness or numbness in your arms or legs.  You develop nausea or vomiting.  You develop abdominal pain.  You feel faint. Document Released: 09/02/2005 Document Revised: 03/03/2012 Document Reviewed: 01/04/2014 [facility] Patient Information 2015 [place], [place]. This information is not intended to replace advice given to you by your health care provider. Make sure you discuss any questions you have with your health care provider.  Back Exercises Back exercises help treat and prevent back injuries. The goal of back exercises is to increase the strength of your abdominal and back muscles and the flexibility of your back. These exercises should be started when you no longer have back pain. Back exercises include:  Pelvic Tilt. Lie on your back with your knees bent. Tilt your pelvis until the lower part of your back is against the floor. Hold this position 5 to 10 sec and repeat 5 to 10 times.  Knee to Chest. Pull first 1 knee up against your chest and hold for 20 to 30 seconds, repeat this with the other knee, and then both knees. This may be done with the other leg straight or bent, whichever feels better.  Sit-Ups or Curl-Ups. Bend your knees 90 degrees. Start with tilting your pelvis, and do a partial, slow sit-up, lifting your trunk only 30 to 45 degrees off the floor. Take at least 2 to 3 seconds for each sit-up. Do not do sit-ups with your knees out straight. If partial sit-ups are difficult, simply do the above but with only tightening your abdominal muscles and holding it as directed.  Hip-Lift. Lie on your back with your knees flexed 90 degrees. Push down with your feet and shoulders as you raise your hips a couple inches off the floor; hold for 10 seconds, repeat 5 to 10 times.  Back arches. Lie on your stomach, propping yourself up on bent elbows. Slowly press on your hands, causing an arch in your low back. Repeat 3 to 5 times. Any initial stiffness and discomfort should  lessen with repetition over time.  Shoulder-Lifts. Lie face down with arms beside your body. Keep hips and torso pressed to floor as you slowly lift your head and shoulders off the floor. Do not overdo your exercises, especially in the beginning. Exercises may cause you some mild back discomfort which lasts for a few minutes; however, if the pain is more severe, or lasts for more than 15 minutes, do not continue exercises until you see your caregiver. Improvement with exercise therapy for back problems is slow.  See your caregivers for assistance with developing a proper back exercise program. Document Released: 10/10/2004 Document Revised: 11/25/2011 Document Reviewed: 07/04/2011 [facility] Patient Information 2015 [place], [place]. This information is not intended to replace advice given to you by your health care provider. Make sure you discuss any questions you have with your health care provider.

## 2014-07-29 NOTE — ED Notes (Signed)
Patient seen last week for same Patient works at [employer] as a Scientist, forensicmover Patient ambulatory from triage Patient in NAD

## 2014-07-29 NOTE — ED Provider Notes (Signed)
CSN: [id number]     Arrival date & time 07/29/14  [id number] History   First MD Initiated Contact with Patient 07/29/14 [phone]     Chief Complaint  Patient presents with  . Back Pain     (Consider location/radiation/quality/duration/timing/severity/associated sxs/prior Treatment) HPI   Blake Bishop is a 38 y.o. male Who reports that he has right upper back pain present for 3 days, associated with lifting and cutting boxes at work.  This is a recurrent problem.  He had similar condition last week was treated here with narcotic and muscle relaxer, as an outpatient.  Has a chronic history of ER visits for painful conditions.  He denies weakness, dizziness, nausea, vomiting, fever or chills.  There are no other known modifying factors.   History reviewed. No pertinent past medical history. History reviewed. No pertinent past surgical history. No family history on file. History  Substance Use Topics  . Smoking status: Current Every Day Smoker -- 0.25 packs/day    Types: Cigarettes  . Smokeless tobacco: Never Used  . Alcohol Use: No    Review of Systems  All other systems reviewed and are negative.     Allergies  Review of patient's allergies indicates no known allergies.  Home Medications   Prior to Admission medications   Medication Sig Start Date End Date Taking? Authorizing Provider  acetaminophen (TYLENOL) 325 MG tablet Take 325-650 mg by mouth every 6 (six) hours as needed for pain.    Historical Provider, MD  HYDROcodone-acetaminophen (NORCO/VICODIN) 5-325 MG per tablet Take 1 tablet by mouth every 4 (four) hours as needed. 07/18/14   Garlon HatchetLisa M Sanders, PA-C  ibuprofen (ADVIL,MOTRIN) 800 MG tablet Take 1 tablet (800 mg total) by mouth 3 (three) times daily. 12/09/12   Sunnie NielsenBrian Opitz, MD  methocarbamol (ROBAXIN) 500 MG tablet Take 1 tablet (500 mg total) by mouth 2 (two) times daily. 07/18/14   Garlon HatchetLisa M Sanders, PA-C   BP 128/88 mmHg  Pulse 85  Temp(Src) 97.4 F (36.3 C) (Oral)   Resp 16  SpO2 100% Physical Exam  Constitutional: He is oriented to person, place, and time. He appears well-developed and well-nourished. No distress.  HENT:  Head: Normocephalic and atraumatic.  Right Ear: External ear normal.  Left Ear: External ear normal.  Eyes: Conjunctivae and EOM are normal. Pupils are equal, round, and reactive to light.  Neck: Normal range of motion and phonation normal. Neck supple.  Cardiovascular: Normal rate.   Pulmonary/Chest: Effort normal. He exhibits no bony tenderness.  Musculoskeletal: Normal range of motion.  Mild tenderness right lower parathoracic vertebrae area.  Normal range of motion, back.  Neurological: He is alert and oriented to person, place, and time. No cranial nerve deficit or sensory deficit. He exhibits normal muscle tone. Coordination normal.  Skin: Skin is warm, dry and intact.  Psychiatric: He has a normal mood and affect. His behavior is normal. Judgment and thought content normal.  Nursing note and vitals reviewed.   ED Course  Procedures (including critical care time)  Medications  ketorolac (TORADOL) injection 60 mg (60 mg Intramuscular Given 07/29/14 0934)    Patient Vitals for the past 24 hrs:  BP Temp Temp src Pulse Resp SpO2  07/29/14 0917 128/88 mmHg 97.4 F (36.3 C) Oral 85 16 100 %       Labs Review Labs Reviewed - No data to display  Imaging Review No results found.   EKG Interpretation None      MDM   Final  diagnoses:  Right-sided thoracic back pain    Musculoskeletal pain, without evidence for fracture, discitis, radiculopathy or acute chest syndrome problems.   Nursing Notes Reviewed/ Care Coordinated Applicable Imaging Reviewed Interpretation of Laboratory Data incorporated into ED treatment  The patient appears reasonably screened and/or stabilized for discharge and I doubt any other medical condition or other [facility] requiring further screening, evaluation, or treatment in the ED at this  time prior to discharge.  Plan: Home Medications- Advil; Home Treatments- Heat; return here if the recommended treatment, does not improve the symptoms; Recommended follow up- Ortho or PCP for ongoing care of recurrent back pain. Consider Physical Therapy.     Flint MelterElliott L Herminio Kniskern, MD 07/29/14 [phone]

## 2015-01-10 ENCOUNTER — Encounter (HOSPITAL_COMMUNITY): Payer: Self-pay | Admitting: *Deleted

## 2015-01-10 ENCOUNTER — Emergency Department (HOSPITAL_COMMUNITY)
Admission: EM | Admit: 2015-01-10 | Discharge: 2015-01-10 | Discharge status: Against Medical Advice | Payer: Self-pay | Attending: Emergency Medicine | Admitting: Emergency Medicine

## 2015-01-10 DIAGNOSIS — R51 Headache: Secondary | ICD-10-CM | POA: Insufficient documentation

## 2015-01-10 DIAGNOSIS — Z720 Tobacco use: Secondary | ICD-10-CM | POA: Insufficient documentation

## 2015-01-10 NOTE — ED Notes (Signed)
PT no answer.

## 2015-01-10 NOTE — ED Notes (Signed)
Pt reports headache for 4 days. Denies any associated symptoms. Reports headache in the past but has never been seen for them.

## 2015-03-19 ENCOUNTER — Encounter (HOSPITAL_COMMUNITY): Payer: Self-pay

## 2015-03-19 ENCOUNTER — Emergency Department (HOSPITAL_COMMUNITY)
Admission: EM | Admit: 2015-03-19 | Discharge: 2015-03-19 | Disposition: A | Discharge status: Home / Self Care | Payer: Self-pay | Attending: Emergency Medicine | Admitting: Emergency Medicine

## 2015-03-19 DIAGNOSIS — G43919 Migraine, unspecified, intractable, without status migrainosus: Secondary | ICD-10-CM | POA: Insufficient documentation

## 2015-03-19 DIAGNOSIS — Z720 Tobacco use: Secondary | ICD-10-CM | POA: Insufficient documentation

## 2015-03-19 MED ORDER — METOCLOPRAMIDE HCL 5 MG/ML IJ SOLN
10.0000 mg | Freq: Once | INTRAMUSCULAR | Status: AC
  Administered 2015-03-19: 10 mg via INTRAVENOUS
  Filled 2015-03-19: qty 2

## 2015-03-19 MED ORDER — DEXAMETHASONE SODIUM PHOSPHATE 10 MG/ML IJ SOLN
10.0000 mg | Freq: Once | INTRAMUSCULAR | Status: AC
  Administered 2015-03-19: 10 mg via INTRAVENOUS
  Filled 2015-03-19: qty 1

## 2015-03-19 MED ORDER — DIPHENHYDRAMINE HCL 50 MG/ML IJ SOLN
25.0000 mg | Freq: Once | INTRAMUSCULAR | Status: AC
  Administered 2015-03-19: 25 mg via INTRAVENOUS
  Filled 2015-03-19: qty 1

## 2015-03-19 NOTE — ED Provider Notes (Signed)
CSN: [id number]     Arrival date & time 03/19/15  1640 History   First MD Initiated Contact with Patient 03/19/15 1700     Chief Complaint  Patient presents with  . Headache     (Consider location/radiation/quality/duration/timing/severity/associated sxs/prior Treatment) Patient is a 39 y.o. male presenting with headaches. The history is provided by the patient.  Headache Pain location:  L parietal Quality:  Sharp Radiates to:  Eyes Severity currently:  7/10 Severity at highest:  9/10 Onset quality:  Gradual Duration:  4 days Timing:  Intermittent Progression:  Waxing and waning Chronicity:  New Similar to prior headaches: no   Context: activity, bright light and emotional stress   Relieved by:  NSAIDs Worsened by:  Light and sound Associated symptoms: photophobia   Associated symptoms: no abdominal pain, no back pain, no blurred vision, no congestion, no eye pain, no fatigue, no fever, no focal weakness, no nausea, no near-syncope, no neck pain, no neck stiffness, no numbness, no paresthesias, no syncope, no vomiting and no weakness   Risk factors comment:  Recent emotional stress from a cousin who died of cancer this past week   History reviewed. No pertinent past medical history. History reviewed. No pertinent past surgical history. No family history on file. History  Substance Use Topics  . Smoking status: Current Every Day Smoker -- 1.00 packs/day    Types: Cigarettes  . Smokeless tobacco: Never Used  . Alcohol Use: No    Review of Systems  Constitutional: Negative for fever and fatigue.  HENT: Negative for congestion.   Eyes: Positive for photophobia. Negative for blurred vision and pain.  Cardiovascular: Negative for syncope and near-syncope.  Gastrointestinal: Negative for nausea, vomiting and abdominal pain.  Musculoskeletal: Negative for back pain, neck pain and neck stiffness.  Neurological: Positive for headaches. Negative for focal weakness, weakness,  numbness and paresthesias.  All other systems reviewed and are negative.     Allergies  Review of patient's allergies indicates no known allergies.  Home Medications   Prior to Admission medications   Medication Sig Start Date End Date Taking? Authorizing Provider  acetaminophen (TYLENOL) 325 MG tablet Take 325-650 mg by mouth every 6 (six) hours as needed for pain.   Yes Historical Provider, MD  ibuprofen (ADVIL,MOTRIN) 200 MG tablet Take 600 mg by mouth every 6 (six) hours as needed for headache.   Yes Historical Provider, MD   BP 130/89 mmHg  Pulse 93  Temp(Src) 97.8 F (36.6 C) (Oral)  Resp 14  SpO2 100% Physical Exam  Constitutional: He is oriented to person, place, and time. He appears well-developed and well-nourished. No distress.  HENT:  Head: Normocephalic and atraumatic.  Right Ear: Tympanic membrane normal.  Left Ear: Tympanic membrane normal.  Mouth/Throat: Oropharynx is clear and moist.  Eyes: Conjunctivae and EOM are normal. Pupils are equal, round, and reactive to light. Right eye exhibits no discharge. Left eye exhibits no discharge.  photophobia into the left eye  Neck: Normal range of motion. Neck supple. No spinous process tenderness present. No rigidity. No Brudzinski's sign and no Kernig's sign noted.  Cardiovascular: Normal rate, normal heart sounds and intact distal pulses.   No murmur heard. Pulmonary/Chest: Effort normal and breath sounds normal. No respiratory distress. He has no wheezes. He has no rales.  Abdominal: Soft. He exhibits no distension. There is no tenderness.  Musculoskeletal: Normal range of motion. He exhibits no edema or tenderness.  Lymphadenopathy:    He has no cervical adenopathy.  Neurological: He is alert and oriented to person, place, and time. He has normal strength. No cranial nerve deficit or sensory deficit. Coordination and gait normal. GCS eye subscore is 4. GCS verbal subscore is 5. GCS motor subscore is 6.  Normal gait  and finger to nose  Skin: Skin is warm and dry.  Psychiatric: He has a normal mood and affect. His behavior is normal.  Nursing note and vitals reviewed.   ED Course  Procedures (including critical care time) Labs Review Labs Reviewed - No data to display  Imaging Review No results found.   EKG Interpretation None      MDM   Final diagnoses:  Intractable migraine without status migrainosus, unspecified migraine type    Pt with HA most typical of a migraine without sx suggestive of SAH(sudden onset, worst of life, or deficits), infection, or cavernous vein thrombosis.  Normal neuro exam and vital signs. Will give HA cocktail and re-eval.  6:26 PM Pt feeling better and requesting to go home.    Gwyneth SproutWhitney Keylin Ferryman, MD 03/19/15 [phone]

## 2015-03-19 NOTE — ED Notes (Signed)
Pt presents with c/o headache that he reports has been present since Thursday. Pt reports no hx of migraines. Pt reports some blurred vision, feels like the headache is behind his eyes. Pt denies any N/V.

## 2015-03-19 NOTE — Discharge Instructions (Signed)

## 2015-03-23 ENCOUNTER — Encounter (HOSPITAL_COMMUNITY): Payer: Self-pay

## 2015-03-23 ENCOUNTER — Emergency Department (HOSPITAL_COMMUNITY)
Admission: EM | Admit: 2015-03-23 | Discharge: 2015-03-23 | Discharge status: Against Medical Advice | Payer: Self-pay | Attending: Emergency Medicine | Admitting: Emergency Medicine

## 2015-03-23 DIAGNOSIS — Z720 Tobacco use: Secondary | ICD-10-CM | POA: Insufficient documentation

## 2015-03-23 DIAGNOSIS — R51 Headache: Secondary | ICD-10-CM | POA: Insufficient documentation

## 2015-03-23 DIAGNOSIS — H538 Other visual disturbances: Secondary | ICD-10-CM | POA: Insufficient documentation

## 2015-03-23 NOTE — ED Notes (Signed)
Pt was outside when called for room in the back. When this RN went outside and  was talking with pt about going back to a room, pt reported that he was actually feeling better and no longer wanted to be seen.

## 2015-03-23 NOTE — ED Notes (Signed)
Pt presents with c/o headache that has been on and off for one week. Pt was seen on 7/3 for same. Pt denies any light sensitivity at this time, reports some blurred vision in his left eye, common with his headaches.

## 2017-04-14 ENCOUNTER — Encounter (HOSPITAL_COMMUNITY): Payer: Self-pay | Admitting: Emergency Medicine

## 2017-04-14 ENCOUNTER — Emergency Department (HOSPITAL_COMMUNITY)
Admission: EM | Admit: 2017-04-14 | Discharge: 2017-04-14 | Disposition: A | Discharge status: Home / Self Care | Payer: Self-pay | Attending: Emergency Medicine | Admitting: Emergency Medicine

## 2017-04-14 DIAGNOSIS — F17210 Nicotine dependence, cigarettes, uncomplicated: Secondary | ICD-10-CM | POA: Insufficient documentation

## 2017-04-14 DIAGNOSIS — K047 Periapical abscess without sinus: Secondary | ICD-10-CM | POA: Insufficient documentation

## 2017-04-14 MED ORDER — NAPROXEN 500 MG PO TABS: 500.0000 mg | ORAL_TABLET | Freq: Two times a day (BID) | ORAL | 0 refills | Status: DC

## 2017-04-14 MED ORDER — NAPROXEN 250 MG PO TABS
500.0000 mg | ORAL_TABLET | Freq: Once | ORAL | Status: AC
  Administered 2017-04-14: 500 mg via ORAL
  Filled 2017-04-14: qty 2

## 2017-04-14 MED ORDER — PENICILLIN V POTASSIUM 500 MG PO TABS: 500.0000 mg | ORAL_TABLET | Freq: Three times a day (TID) | ORAL | 0 refills | Status: DC

## 2017-04-14 MED ORDER — PENICILLIN V POTASSIUM 250 MG PO TABS
500.0000 mg | ORAL_TABLET | Freq: Once | ORAL | Status: AC
  Administered 2017-04-14: 500 mg via ORAL
  Filled 2017-04-14: qty 2

## 2017-04-14 NOTE — ED Triage Notes (Signed)
Patient complaining of left lower dental pain. Onset 2 days ago. Denies other complaints.

## 2017-04-14 NOTE — Discharge Instructions (Signed)
Please read and follow all provided instructions.  Your diagnoses today include:  1. Dental abscess     The exam and treatment you received today has been provided on an emergency basis only. This is not a substitute for complete medical or dental care.  Tests performed today include:  Vital signs. See below for your results today.   Medications prescribed:   Penicillin - antibiotic  You have been prescribed an antibiotic medicine: take the entire course of medicine even if you are feeling better. Stopping early can cause the antibiotic not to work.   Naproxen - anti-inflammatory pain medication  Do not exceed 500mg  naproxen every 12 hours, take with food  You have been prescribed an anti-inflammatory medication or NSAID. Take with food. Take smallest effective dose for the shortest duration needed for your pain. Stop taking if you experience stomach pain or vomiting.   Take any prescribed medications only as directed.  Home care instructions:  Follow any educational materials contained in this packet.  Follow-up instructions: Please follow-up with your dentist for further evaluation of your symptoms.   Dental Assistance: See attached dental referrals  Return instructions:   Please return to the Emergency Department if you experience worsening symptoms.  Please return if you develop a fever, you develop more swelling in your face or neck, you have trouble breathing or swallowing food.  Please return if you have any other emergent concerns.  Additional Information:  Your vital signs today were: BP (!) 145/114 (BP Location: Right Arm)    Pulse 97    Temp 98.2 F (36.8 C) (Oral)    Resp 18    SpO2 100%  If your blood pressure (BP) was elevated above 135/85 this visit, please have this repeated by your doctor within one month. --------------

## 2017-04-14 NOTE — ED Provider Notes (Signed)
[facility]-EMERGENCY DEPT Provider Note   CSN: [id number] Arrival date & time: 04/14/17  [id number]     History   Chief Complaint Chief Complaint  Patient presents with  . Dental Pain    HPI Blake Bishop is a 41 y.o. male.  Patient presents with 2 days of left lower dental pain, swelling, unrelieved with Tylenol. Pain radiates to the left ear. No neck swelling or difficulty swallowing or breathing. He has had similar symptoms in the past. No other treatments. No other complaints.      History reviewed. No pertinent past medical history.  There are no active problems to display for this patient.   History reviewed. No pertinent surgical history.     Home Medications    Prior to Admission medications   Medication Sig Start Date End Date Taking? Authorizing Provider  acetaminophen (TYLENOL) 325 MG tablet Take 325-650 mg by mouth every 6 (six) hours as needed for pain.    [provider]  ibuprofen (ADVIL,MOTRIN) 200 MG tablet Take 600 mg by mouth every 6 (six) hours as needed for headache.    [provider]  naproxen (NAPROSYN) 500 MG tablet Take 1 tablet (500 mg total) by mouth 2 (two) times daily. 04/14/17   Renne CriglerGeiple, Jordany Russett, PA-C  penicillin v potassium (VEETID) 500 MG tablet Take 1 tablet (500 mg total) by mouth 3 (three) times daily. 04/14/17   Renne CriglerGeiple, Elysabeth Aust, PA-C    Family History History reviewed. No pertinent family history.  Social History Social History  Substance Use Topics  . Smoking status: Current Every Day Smoker    Packs/day: 1.00    Types: Cigarettes  . Smokeless tobacco: Never Used  . Alcohol use No     Allergies   Patient has no known allergies.   Review of Systems Review of Systems  Constitutional: Negative for fever.  HENT: Positive for dental problem and facial swelling. Negative for ear pain, sore throat and trouble swallowing.   Respiratory: Negative for shortness of breath and stridor.   Musculoskeletal: Negative for  neck pain.  Skin: Negative for color change.  Neurological: Negative for headaches.     Physical Exam Updated Vital Signs BP (!) 145/114 (BP Location: Right Arm)   Pulse 97   Temp 98.2 F (36.8 C) (Oral)   Resp 18   SpO2 100%   Physical Exam  Constitutional: He appears well-developed and well-nourished.  HENT:  Head: Normocephalic and atraumatic.  Right Ear: Tympanic membrane, external ear and ear canal normal.  Left Ear: Tympanic membrane, external ear and ear canal normal.  Nose: Nose normal.  Mouth/Throat: Uvula is midline, oropharynx is clear and moist and mucous membranes are normal. No trismus in the jaw. Abnormal dentition. Dental caries present. No dental abscesses or uvula swelling. No tonsillar abscesses.  Patient with advanced periodontal disease. Multiple caries. Patient with swelling noted to the gum line at the base of tooth #19. Appears to be a developing abscess. Minimal fluctuance at this time.  Eyes: Pupils are equal, round, and reactive to light.  Neck: Normal range of motion. Neck supple.  No neck swelling or Lugwig's angina  Neurological: He is alert.  Skin: Skin is warm and dry.  Psychiatric: He has a normal mood and affect.  Nursing note and vitals reviewed.    ED Treatments / Results   Procedures Procedures (including critical care time)  Medications Ordered in ED Medications  penicillin v potassium (VEETID) tablet 500 mg (not administered)  naproxen (NAPROSYN) tablet 500  mg (not administered)     Initial Impression / Assessment and Plan / ED Course  I have reviewed the triage vital signs and the nursing notes.  Pertinent labs & imaging results that were available during my care of the patient were reviewed by me and considered in my medical decision making (see chart for details).     6:20 AM Patient seen and examined. Medications ordered.   Vital signs reviewed and are as follows: BP (!) 145/114 (BP Location: Right Arm)   Pulse 97    Temp 98.2 F (36.8 C) (Oral)   Resp 18   SpO2 100%   Patient counseled on use of narcotic pain medications. Counseled not to combine these medications with others containing tylenol. Urged not to drink alcohol, drive, or perform any other activities that requires focus while taking these medications. The patient verbalizes understanding and agrees with the plan.  Patient counseled to take prescribed medications as directed, return with worsening facial or neck swelling, and to follow-up with their dentist as soon as possible.     Final Clinical Impressions(s) / ED Diagnoses   Final diagnoses:  Dental abscess   Patient with toothache. No fever. Exam unconcerning for Ludwig's angina or other deep tissue infection in neck.   As there is gum swelling, erythema, and facial swelling, will treat with antibiotic and NSAID. Urged patient to follow-up with dentist.      New Prescriptions New Prescriptions   NAPROXEN (NAPROSYN) 500 MG TABLET    Take 1 tablet (500 mg total) by mouth 2 (two) times daily.   PENICILLIN V POTASSIUM (VEETID) 500 MG TABLET    Take 1 tablet (500 mg total) by mouth 3 (three) times daily.     Renne CriglerGeiple, Affie Gasner, PA-C 04/14/17 [id number]    Glynn Octaveancour, Stephen, MD 04/14/17 [phone]

## 2017-04-15 ENCOUNTER — Encounter (HOSPITAL_COMMUNITY): Payer: Self-pay | Admitting: *Deleted

## 2017-04-15 ENCOUNTER — Emergency Department (HOSPITAL_COMMUNITY)
Admission: EM | Admit: 2017-04-15 | Discharge: 2017-04-15 | Disposition: A | Discharge status: Home / Self Care | Payer: Self-pay | Attending: Emergency Medicine | Admitting: Emergency Medicine

## 2017-04-15 DIAGNOSIS — F17210 Nicotine dependence, cigarettes, uncomplicated: Secondary | ICD-10-CM | POA: Insufficient documentation

## 2017-04-15 DIAGNOSIS — K047 Periapical abscess without sinus: Secondary | ICD-10-CM | POA: Insufficient documentation

## 2017-04-15 MED ORDER — BUPIVACAINE-EPINEPHRINE (PF) 0.5% -1:200000 IJ SOLN
1.8000 mL | Freq: Once | INTRAMUSCULAR | Status: AC
  Administered 2017-04-15: 1.8 mL
  Filled 2017-04-15: qty 1.8

## 2017-04-15 MED ORDER — IBUPROFEN 800 MG PO TABS: 800.0000 mg | ORAL_TABLET | Freq: Three times a day (TID) | ORAL | 0 refills | Status: DC

## 2017-04-15 MED ORDER — OXYCODONE-ACETAMINOPHEN 5-325 MG PO TABS
ORAL_TABLET | ORAL | Status: AC
  Administered 2017-04-15: 1
  Filled 2017-04-15: qty 1

## 2017-04-15 MED ORDER — ACETAMINOPHEN 500 MG PO TABS: 500.0000 mg | ORAL_TABLET | Freq: Four times a day (QID) | ORAL | 0 refills | Status: DC | PRN

## 2017-04-15 MED ORDER — OXYCODONE-ACETAMINOPHEN 5-325 MG PO TABS: 1.0000 | ORAL_TABLET | ORAL | Status: DC | PRN

## 2017-04-15 NOTE — ED Triage Notes (Signed)
Pt here with left lower dental pain and swelling

## 2017-04-15 NOTE — ED Notes (Signed)
Bupivacaine at bedside, Trinna PostAlex, GeorgiaPA aware.

## 2017-04-15 NOTE — ED Provider Notes (Signed)
[facility]-EMERGENCY DEPT Provider Note   CSN: [id number] Arrival date & time: 04/15/17  1215  By signing my name below, I, Blake Bishop, attest that this documentation has been prepared under the direction and in the presence of non-physician practitioner, Santhiago Collingsworth, PA-C. Electronically Signed: Rosana Fretana Bishop, ED Scribe. 04/15/17. 3:12 PM.  History   Chief Complaint Chief Complaint  Patient presents with  . Dental Pain   The history is provided by the patient. No language interpreter was used.   HPI Comments: Blake Bishop is a 41 y.o. male who presents to the Emergency Department complaining of constant, moderate left lower dental pain onset 2 days ago. Pt was seen in the ED yesterday and prescribed antibiotics, which he has bee compliant with so far. He states his pain is improving but the swelling to the left side of his face has gotten worse. He reports some worsening pain opening his mouth. Pt denies fever or any other complaints at this time.  History reviewed. No pertinent past medical history.  There are no active problems to display for this patient.   History reviewed. No pertinent surgical history.   Home Medications    Prior to Admission medications   Medication Sig Start Date End Date Taking? Authorizing Provider  acetaminophen (TYLENOL) 500 MG tablet Take 1 tablet (500 mg total) by mouth every 6 (six) hours as needed. 04/15/17   Lola Czerwonka, Waylan BogaAlexandra M, PA-C  ibuprofen (ADVIL,MOTRIN) 800 MG tablet Take 1 tablet (800 mg total) by mouth 3 (three) times daily. 04/15/17   Shalita Notte, Waylan BogaAlexandra M, PA-C  naproxen (NAPROSYN) 500 MG tablet Take 1 tablet (500 mg total) by mouth 2 (two) times daily. 04/14/17   Renne CriglerGeiple, Joshua, PA-C  penicillin v potassium (VEETID) 500 MG tablet Take 1 tablet (500 mg total) by mouth 3 (three) times daily. 04/14/17   Renne CriglerGeiple, Joshua, PA-C    Family History No family history on file.  Social History Social History  Substance Use Topics  . Smoking  status: Current Every Day Smoker    Packs/day: 1.00    Types: Cigarettes  . Smokeless tobacco: Never Used  . Alcohol use No     Allergies   Patient has no known allergies.   Review of Systems Review of Systems  Constitutional: Negative for fever.  HENT: Positive for dental problem and facial swelling.      Physical Exam Updated Vital Signs BP (!) 153/115 (BP Location: Left Arm)   Pulse (!) 104   Temp 98.4 F (36.9 C) (Oral)   Resp 18   SpO2 100%   Physical Exam  Constitutional: He appears well-developed and well-nourished. No distress.  HENT:  Head: Normocephalic and atraumatic.  Mouth/Throat: No trismus in the jaw. Dental abscesses present. No oropharyngeal exudate.    Eyes: Pupils are equal, round, and reactive to light. Conjunctivae are normal. Right eye exhibits no discharge. Left eye exhibits no discharge. No scleral icterus.  Neck: Normal range of motion. Neck supple. No thyromegaly present.  Cardiovascular: Regular rhythm, normal heart sounds and intact distal pulses.  Exam reveals no gallop and no friction rub.   No murmur heard. Pulmonary/Chest: Effort normal and breath sounds normal. No stridor. No respiratory distress. He has no wheezes. He has no rales.  Abdominal: Soft. Bowel sounds are normal. He exhibits no distension. There is no tenderness. There is no rebound and no guarding.  Musculoskeletal: He exhibits no edema.  Lymphadenopathy:    He has no cervical adenopathy.  Neurological: He is alert.  Coordination normal.  Skin: Skin is warm and dry. No rash noted. He is not diaphoretic. No pallor.  Psychiatric: He has a normal mood and affect.  Nursing note and vitals reviewed.    ED Treatments / Results  DIAGNOSTIC STUDIES: Oxygen Saturation is 100% on RA, normal by my interpretation.   COORDINATION OF CARE: 3:04 PM-Discussed next steps with pt including draining the area and follow up with a dentist. Pt verbalized understanding and is agreeable  with the plan.   Labs (all labs ordered are listed, but only abnormal results are displayed) Labs Reviewed - No data to display  EKG  EKG Interpretation None       Radiology No results found.  Procedures Procedures (including critical care time)  INCISION AND DRAINAGE Performed by: Emi HolesAlexandra M Jabril Pursell Consent: Verbal consent obtained. Risks and benefits: risks, benefits and alternatives were discussed Type: abscess  Body area: dental  Anesthesia: local infiltration  Incision was made with a 18G.  Local anesthetic: Marcaine with epi 0.5% 1:200000  Anesthetic total: 1.8 ml  Drainage: purulent from base of the tooth, however withstab incision with 18-gauge, only blood  Drainage amount: small  Packing material: none  Patient tolerance: Patient tolerated the procedure well with no immediate complications.       Medications Ordered in ED Medications  oxyCODONE-acetaminophen (PERCOCET/ROXICET) 5-325 MG per tablet 1 tablet (not administered)  oxyCODONE-acetaminophen (PERCOCET/ROXICET) 5-325 MG per tablet (1 tablet  Given 04/15/17 1333)  bupivacaine-epinephrine (MARCAINE W/ EPI) 0.5% -1:200000 injection 1.8 mL (1.8 mLs Infiltration Given by Other 04/15/17 1548)     Initial Impression / Assessment and Plan / ED Course  I have reviewed the triage vital signs and the nursing notes.  Pertinent labs & imaging results that were available during my care of the patient were reviewed by me and considered in my medical decision making (see chart for details).     Patient with dentalgia.  Small abscess draining purulent fluid from base of tooth prior to my incision and drainage. I&D did not yield much more purulent drainage. Exam not concerning for Ludwig's angina or pharyngeal abscess.  Will continue treat with penicillin. Pt instructed to follow-up with dentist as soon as possible.  Discussed return precautions. Patient understands and agrees with plan. Patient vitals stable  throughout ED course and discharged in satisfactory condition.  Final Clinical Impressions(s) / ED Diagnoses   Final diagnoses:  Dental abscess    New Prescriptions Discharge Medication List as of 04/15/2017  3:50 PM     I personally performed the services described in this documentation, which was scribed in my presence. The recorded information has been reviewed and is accurate.     Emi HolesLaw, Jawaun Celmer M, PA-C 04/15/17 1717    Raeford RazorKohut, Stephen, MD 04/20/17 [phone]

## 2017-04-15 NOTE — Discharge Instructions (Signed)
Medications: ibuprofen, Tylenol  Treatment: continue taking your penicillin as prescribed. Take ibuprofen every 8 hours as needed for your pain. You can alternate Tylenol as prescribed.  Follow-up: Please see a dentist as soon as possible. If you call Dr. Lucky CowboyKnox within 24 hours of this visit and bring your discharge paperwork to your visit, [place] will assist you with your financial expenses. Please return to the emergency department if you develop any new or worsening symptoms.

## 2017-04-15 NOTE — ED Notes (Signed)
ED Provider at bedside. 

## 2018-07-02 ENCOUNTER — Other Ambulatory Visit: Payer: Self-pay

## 2018-07-02 ENCOUNTER — Encounter (HOSPITAL_COMMUNITY): Payer: Self-pay

## 2018-07-02 ENCOUNTER — Emergency Department (HOSPITAL_COMMUNITY)
Admission: EM | Admit: 2018-07-02 | Discharge: 2018-07-02 | Disposition: A | Discharge status: Home / Self Care | Payer: Self-pay | Attending: Emergency Medicine | Admitting: Emergency Medicine

## 2018-07-02 DIAGNOSIS — G5692 Unspecified mononeuropathy of left upper limb: Secondary | ICD-10-CM

## 2018-07-02 DIAGNOSIS — G629 Polyneuropathy, unspecified: Secondary | ICD-10-CM | POA: Insufficient documentation

## 2018-07-02 DIAGNOSIS — F17210 Nicotine dependence, cigarettes, uncomplicated: Secondary | ICD-10-CM | POA: Insufficient documentation

## 2018-07-02 DIAGNOSIS — Z79899 Other long term (current) drug therapy: Secondary | ICD-10-CM | POA: Insufficient documentation

## 2018-07-02 NOTE — ED Triage Notes (Signed)
Pt endorses left hand tingling going up the left arm x 1 month. States :"I think It's carpal tunnel" No neuro sx. Full ROM. VSS.

## 2018-07-02 NOTE — Discharge Instructions (Addendum)
You were seen in the ER for tingling and numbness of your left hand.  Given your symptoms and your exam I think that you have neuropathy or a nerve palsy that involves your median or radial nerves of your left hand.  This is usually from overuse or repetitive movements of the hand and wrist.  Treatment includes immobilization with a wrist splint to avoid flexion and extension of the wrist.  Take 600 mg of ibuprofen and/or 500 to 1000 mg of acetaminophen every 6-8 hours to help with inflammation and swelling.  Ice.  Rest.  Symptoms may take several weeks to improve and they may also return based on your activities.  Sometimes surgery is needed to help with the symptoms, you need to follow-up with hand surgery in the next 2 to 3 weeks if the symptoms are persistent or worsening.  Return to the ER if there is any skin redness, swelling, neck pain, one-sided drooping or weakness to your extremities.

## 2018-07-02 NOTE — ED Provider Notes (Signed)
Patient placed in Quick Look pathway, seen and evaluated   Chief Complaint: Intermittent L hand/arm numbness/tingling  HPI:   42 year old male presents with intermittent L hand and arm numbness/tingling for the past 1 month. He states that he came today because the tingling was more persistent today. He even used a lighter on his thumb to see if he could feel it and couldn't. The paresthesias are worse in the thumb but he also has it in the index finger and the forearm. He has not tried anything for his symptoms. He works in [employer] at [employer].  ROS: +N/T  Physical Exam:   Gen: No distress  Neuro: Awake and Alert  Skin: Warm    Focused Exam: Subjective numbness/tingling of the left thumb. Normal equal and bilateral grip strength and upper extremity strength.   Initiation of care has begun. The patient has been counseled on the process, plan, and necessity for staying for the completion/evaluation, and the remainder of the medical screening examination    Bethel Born, PA-C 07/02/18 1400    Terrilee Files, MD 07/03/18 1230

## 2018-07-02 NOTE — ED Provider Notes (Signed)
[facility] EMERGENCY DEPARTMENT Provider Note   CSN: [id number] Arrival date & time: 07/02/18  1347     History   Chief Complaint Chief Complaint  Patient presents with  . Numbness    HPI Blake Bishop is a 42 y.o. male with no past medical history is here for evaluation of numbness and tingling to the left hand.  Onset 1 month ago.  Describes loss of sensation and tingling sensation to the palm of his thumb, index, middle fingers.  Initially was intermittent but now has become more persistent and severe.  Symptoms occasionally intermittently radiate into the distal forearm.  No interventions.  No alleviating or aggravating factors.  He is right-hand dominant.  He works at [employer] and [employer].  He denies any neck pain, direct trauma to the left upper extremity, weakness or dropping things more frequently lately.  No overlying swelling, redness or warmth.  HPI  History reviewed. No pertinent past medical history.  There are no active problems to display for this patient.   History reviewed. No pertinent surgical history.      Home Medications    Prior to Admission medications   Medication Sig Start Date End Date Taking? Authorizing Provider  acetaminophen (TYLENOL) 500 MG tablet Take 1 tablet (500 mg total) by mouth every 6 (six) hours as needed. 04/15/17   Law, Waylan Boga, PA-C  ibuprofen (ADVIL,MOTRIN) 800 MG tablet Take 1 tablet (800 mg total) by mouth 3 (three) times daily. 04/15/17   Law, Waylan Boga, PA-C  naproxen (NAPROSYN) 500 MG tablet Take 1 tablet (500 mg total) by mouth 2 (two) times daily. 04/14/17   Renne Crigler, PA-C  penicillin v potassium (VEETID) 500 MG tablet Take 1 tablet (500 mg total) by mouth 3 (three) times daily. 04/14/17   Renne Crigler, PA-C    Family History History reviewed. No pertinent family history.  Social History Social History   Tobacco Use  . Smoking status: Current Every Day Smoker    Packs/day: 1.00   Types: Cigarettes  . Smokeless tobacco: Never Used  Substance Use Topics  . Alcohol use: Yes    Comment: every other day  . Drug use: No     Allergies   Patient has no known allergies.   Review of Systems Review of Systems  Neurological: Positive for numbness.       Paresthesias  All other systems reviewed and are negative.    Physical Exam Updated Vital Signs BP (!) 135/95 (BP Location: Right Arm)   Pulse 100   Temp 98 F (36.7 C) (Oral)   Resp 16   Ht 5\' 6"  (1.676 m)   Wt 63.5 kg   SpO2 98%   BMI 22.60 kg/m   Physical Exam  Constitutional: He is oriented to person, place, and time. He appears well-developed and well-nourished.  Non-toxic appearance.  HENT:  Head: Normocephalic.  Right Ear: External ear normal.  Left Ear: External ear normal.  Nose: Nose normal.  Eyes: Conjunctivae and EOM are normal.  Neck: Full passive range of motion without pain.  Cardiovascular: Normal rate.  Brisk cap refill to left fingertips.  Pulmonary/Chest: Effort normal. No tachypnea. No respiratory distress.  Musculoskeletal: Normal range of motion.  Left hand: Positive Tinel's and Phalen's test.  Positive carpal compression sending tingling to the index and middle fingers.  No thenar or hyperthenar prominence wasting.  5/5 strength with handgrip and thumb opposition to index and fifth digit against resistance and pulp.  Normal  wrist flexion and extension against resistance, no wrist drop.  No focal bony tenderness to the scaphoid.  Full range of motion of all digits and wrist without any pain.  No overlying edema or erythema to the left hand or wrist.  Left elbow: No medial or lateral epicondyle tenderness.  C-spine: No midline C-spine tenderness or paraspinal muscle tenderness.  Neurological: He is alert and oriented to person, place, and time.  Sensation to light touch, two-point discrimination, sharp and dull intact in median, ulnar and radial nerve distribution of the left hand.   Skin: Skin is warm and dry. Capillary refill takes less than 2 seconds.  Psychiatric: His behavior is normal. Thought content normal.     ED Treatments / Results  Labs (all labs ordered are listed, but only abnormal results are displayed) Labs Reviewed - No data to display  EKG None  Radiology No results found.  Procedures Procedures (including critical care time)  Medications Ordered in ED Medications - No data to display   Initial Impression / Assessment and Plan / ED Course  I have reviewed the triage vital signs and the nursing notes.  Pertinent labs & imaging results that were available during my care of the patient were reviewed by me and considered in my medical decision making (see chart for details).    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  He has no neck pain, C-spine spinous process tenderness or paraspinal muscle tenderness.  Negative Spurling's test making cervical radiculopathy less likely.  His extremity is neurovascularly intact without obvious hyperthenar/thenar prominence wasting, weakness.  There has been no trauma and I do not think emergent imaging is indicated today.  We will place wrist in the splint to treat for wrist neuropathy.  Will discharge with rice protocol, high-dose NSAIDs and recommended avoiding aggravating activities.  Educated patient on red flag symptoms that would warrant return to ED, patient verbalized understanding.  Patient advised to f/u with PCP for possibly PT and long term treatment of symptoms.   Final Clinical Impressions(s) / ED Diagnoses   Final diagnoses:  Neuropathy of left hand    ED Discharge Orders    None       Blake Handy, PA-C 07/02/18 1514    Arby Barrette, MD 07/02/18 [phone]

## 2018-07-02 NOTE — ED Notes (Signed)
Declined W/C at D/C and was escorted to lobby by RN. 

## 2019-07-02 ENCOUNTER — Other Ambulatory Visit: Payer: Self-pay

## 2019-07-02 DIAGNOSIS — Z20822 Contact with and (suspected) exposure to covid-19: Secondary | ICD-10-CM

## 2019-07-04 LAB — NOVEL CORONAVIRUS, NAA: SARS-CoV-2, NAA: NOT DETECTED

## 2021-01-11 ENCOUNTER — Other Ambulatory Visit: Payer: Self-pay

## 2021-01-11 ENCOUNTER — Ambulatory Visit (HOSPITAL_COMMUNITY)
Admission: EM | Admit: 2021-01-11 | Discharge: 2021-01-11 | Disposition: A | Discharge status: Home / Self Care | Payer: Self-pay | Attending: Family Medicine | Admitting: Family Medicine

## 2021-01-11 DIAGNOSIS — U071 COVID-19: Secondary | ICD-10-CM | POA: Insufficient documentation

## 2021-01-11 NOTE — ED Triage Notes (Signed)
Pt is here today for a COVID test. He denies sxs.

## 2021-01-12 LAB — SARS CORONAVIRUS 2 (TAT 6-24 HRS): SARS Coronavirus 2: POSITIVE — AB

## 2021-01-23 ENCOUNTER — Ambulatory Visit (HOSPITAL_COMMUNITY)
Admission: EM | Admit: 2021-01-23 | Discharge: 2021-01-23 | Disposition: A | Discharge status: Home / Self Care | Payer: No Payment, Other | Attending: Psychiatry | Admitting: Psychiatry

## 2021-01-23 ENCOUNTER — Other Ambulatory Visit: Payer: Self-pay

## 2021-01-23 DIAGNOSIS — F1010 Alcohol abuse, uncomplicated: Secondary | ICD-10-CM | POA: Diagnosis not present

## 2021-01-23 DIAGNOSIS — Z8616 Personal history of COVID-19: Secondary | ICD-10-CM | POA: Insufficient documentation

## 2021-01-23 DIAGNOSIS — F4323 Adjustment disorder with mixed anxiety and depressed mood: Secondary | ICD-10-CM | POA: Diagnosis not present

## 2021-01-23 DIAGNOSIS — F1994 Other psychoactive substance use, unspecified with psychoactive substance-induced mood disorder: Secondary | ICD-10-CM | POA: Diagnosis not present

## 2021-01-23 NOTE — ED Provider Notes (Signed)
Behavioral Health Urgent Care Medical Screening Exam  Patient Name: Blake Bishop MRN: [id number] Date of Evaluation: 01/23/21 Chief Complaint:   Diagnosis:  Final diagnoses:  Adjustment disorder with mixed anxiety and depressed mood  Alcohol abuse  Substance induced mood disorder (HCC)    History of Present illness: Blake Bishop is a 45 y.o. male who presents with his niece, [name], voluntarily to seek assistance with getting resources for a counselor in the setting of depressed mood, numerous stressors, alcohol use and reported suicide attempt 4 days ago. Pt states that he had a suicide attempt 4 days ago where he put a belt around his neck; he states it broke after 1 minute. He states that he lives with his mom and that his mother was home when it happened. He has been at home the last 4 days with various family members since then and reports feeling a little better since then. He denies experiencing any suicidal thoughts since that time. He states that he has been dealing with numerous stressors; he does not provide much detail but states that he feels like he has been "betrayed" by friends and that people have been talking about him, he has to pay child support for 5 children and describes feeling stressed financially. He states that he recently was diagnosed with covid and had to talk some time off of work at [employer]; he has only worked there for about 2 weeks. He states that he would like to resume his employment there and plans on doing so. He denies SI/HI/AVH. Marland Kitchen He reports depressed mood for the past 2 weeks, feelings of hopelessness intermittently, isolation, decreased energy, decreased appetite and sleeping 5-6 hours a night. He describes insomnia being a result of rumination. He states that he has never had mental health treatment before and states that he feels safe for dischargead can keep himself safe. He describes a safety plan that entails him calling 911, telling a family member  or returning to the [facility] or ED if he has suicidal thoughts again.  Discussed with patient the recommendation of overnight observation to get started on medication. Patient declines this option and states that he would like to be discharged and wants to present during walk in hours to see a counselor and psychiatrist. Pt is voluntary and does not meet criteria for IVC (not an imminent risk to self or others) and although he would benefit from period of observation for medication initiation is not amenable to this plan and does not meet criteria for IVC.   His family member who accompanied him today states that she feels that he is safe for discharge and does not feel that he is a danger to himself. States that he has been doing really well since Friday and thinks that he is safe to return home at this time. States that his mother whom he lives with is also concerned about his alchohol intake and feels that he would benefit. States that if she or any other family has concerns they will bring him back for assessment; states that he will be IVC'd if necessary. Repeatedly states that she currently believes he is safe for discharge and seeking outpatient resources only.   Pt expressed interested in CD-IOP. TTS staff provided him with resources sheet regarding the program.   Past Psychiatric History: Previous Medication Trials: no Previous Psychiatric Hospitalizations: no Previous Suicide Attempts: per HPI History of Violence: no Outpatient psychiatrist: no  Social History: Marital Status: not married Children: 32- aged  [phone] Source of Income: [employer], currently taking time off because of covid (per chart he tested positive about 10 days ago) Education: 11th grade Special Ed: denies Housing Status: with mother, niece, brother History of phys/sexual abuse: denies Easy access to gun: denies  Substance Use (with emphasis over the last 12 months) Recreational Drugs: denies Use of Alcohol:  3-4x/wk. consumes ~4 12-16oz beers Tobacco Use: yes Rehab History: no H/O Complicated Withdrawal: no  Legal History: Past Charges/Incarcerations: denies Pending charges: denies  Family Psychiatric History: denies   Psychiatric Specialty Exam  Presentation  General Appearance:Appropriate for Environment; Casual  Eye Contact:Good  Speech:Clear and Coherent; Normal Rate  Speech Volume:Normal  Handedness:No data recorded  Mood and Affect  Mood:Anxious  Affect:Appropriate; Congruent   Thought Process  Thought Processes:Coherent; Goal Directed; Linear  Descriptions of Associations:Intact  Orientation:Full (Time, Place and Person)  Thought Content:WDL    Hallucinations:None  Ideas of Reference:None  Suicidal Thoughts:No  Homicidal Thoughts:No   Sensorium  Memory:Immediate Good; Recent Good; Remote Fair  Judgment:Fair  Insight:Fair   Executive Functions  Concentration:Fair  Attention Span:Fair  Recall:Good  Fund of Knowledge:Good  Language:No data recorded  Psychomotor Activity  Psychomotor Activity:Normal   Assets  Assets:Communication Skills; Desire for Improvement; Housing; Resilience; Social Support; Vocational/Educational   Sleep  Sleep:Poor  Number of hours: No data recorded  No data recorded  Physical Exam: Physical Exam Constitutional:      Appearance: Normal appearance. He is normal weight.  HENT:     Head: Normocephalic and atraumatic.  Eyes:     Extraocular Movements: Extraocular movements intact.  Pulmonary:     Effort: Pulmonary effort is normal.  Neurological:     Mental Status: He is alert.    Review of Systems  Constitutional: Negative for chills and fever.  HENT: Negative for hearing loss.   Eyes: Negative for discharge and redness.  Cardiovascular: Negative for chest pain.  Gastrointestinal: Negative for abdominal pain.  Neurological: Negative for focal weakness.  Psychiatric/Behavioral: Positive for  depression and substance abuse. Negative for suicidal ideas.   Blood pressure (!) 151/110, pulse 76, temperature 98.6 F (37 C), temperature source Oral, resp. rate 16, SpO2 100 %. There is no height or weight on file to calculate BMI.  Musculoskeletal: Strength & Muscle Tone: within normal limits Gait & Station: normal Patient leans: N/A   [facility] MSE Discharge Disposition for Follow up and Recommendations: Based on my evaluation the patient does not appear to have an emergency medical condition and can be discharged with resources and follow up care in outpatient services for Medication Management, Substance Abuse Intensive Outpatient Program and Individual Therapy   Estella Husk, MD 01/23/2021, 2:36 PM

## 2021-01-23 NOTE — BH Assessment (Signed)
Comprehensive Clinical Assessment (CCA) Note  01/23/2021 Blake Bishop [id number]  Chief Complaint:  Chief Complaint  Patient presents with  . Urgent Emergent Evaluation   Visit Diagnosis:  Substance- Induced Mood Disorder; Alcohol Abuse; Adjustment Disorder with Mixed Anxiety and Depressed Mood  Disposition: Dr. Earlene Plater, MD recommends psychiatric clearance. PT agreeable to follow up with outpt counseling. He was provided written information with [facility] hours for walk-in appointments. Pt also was encouraged to consider CD-IOP program.  CCA Screening, Triage and Referral (STR)  Patient Reported Information How did you hear about [place]? Self   What Is the Reason for Your Visit/Call Today? depression, counseling  How Long Has This Been Causing You Problems? 1 wk - 1 month  What Do You Feel Would Help You the Most Today? Treatment for Depression or other mood problem   Have You Recently Had Any Thoughts About Hurting Yourself? Yes  Are You Planning to Commit Suicide/Harm Yourself At This time? No   Have you Recently Had Thoughts About Hurting Someone Blake Bishop? No   Have You Used Any Alcohol or Drugs in the Past 24 Hours? No     CCA Screening Triage Referral Assessment Type of Contact: Face to face  Is this Initial or Reassessment? Initial   Collateral Involvement: Niece present for assessment with pt  Patient Determined To Be At Risk for Harm To Self or Others Based on Review of Patient Reported Information or Presenting Complaint?Types of Guns/Weapons: denies Are These Weapons Safely Secured?                            No data recorded Who Could Verify You Are Able To Have These Secured: No data recorded Do You Have any Outstanding Charges, Pending Court Dates, Parole/Probation? No data recorded Contacted To Inform of Risk of Harm To Self or Others: No data recorded  Location of Assessment: No data recorded  Does Patient Present under Involuntary Commitment? No  data recorded IVC Papers Initial File Date: No data recorded  [place] of Residence: [place]  Patient Currently Receiving the Following Services: None Determination of Need: Urgent (48 hours)   Options For Referral: [facility]     CCA Biopsychosocial Intake/Chief Complaint:  Pt made suicide attempt on Friday. Pt minimized substance abuse; niece reports family is concerned about his alcohol use.  Current Symptoms/Problems: Depression sx   Patient Reported Schizophrenia/Schizoaffective Diagnosis in Past: No   Strengths: living with family who is concerned for pt  Preferences: outpt  Abilities: No data recorded  Type of Services Patient Feels are Needed: outpt counsling. CD IOP also discussed with pt.   Initial Clinical Notes/Concerns: No data recorded  Mental Health Symptoms Depression:  Change in energy/activity; Difficulty Concentrating; Fatigue; Hopelessness; Increase/decrease in appetite; Irritability; Sleep (too much or little); Tearfulness; Worthlessness   Duration of Depressive symptoms: Greater than two weeks   Mania:  N/A   Anxiety:   Difficulty concentrating; Fatigue; Irritability; Restlessness; Worrying; Sleep; Tension   Psychosis:  None   Duration of Psychotic symptoms: No data recorded  Trauma:  N/A   Obsessions:  N/A   Compulsions:  N/A   Inattention:  N/A   Hyperactivity/Impulsivity:  N/A   Oppositional/Defiant Behaviors:  N/A   Emotional Irregularity:  N/A   Other Mood/Personality Symptoms:  anxious, calm & cooperative    Mental Status Exam Appearance and self-care  Stature:  Average   Weight:  Thin   Clothing:  Casual  Grooming:  Normal   Cosmetic use:  None   Posture/gait:  Tense   Motor activity:  Not Remarkable   Sensorium  Attention:  Distractible   Concentration:  Variable   Orientation:  X5   Recall/memory:  Normal   Affect and Mood  Affect:  Anxious; Appropriate; Depressed   Mood:  Depressed;  Anxious   Relating  Eye contact:  Normal   Facial expression:  Tense; Sad; Depressed   Attitude toward examiner:  Cooperative   Thought and Language  Speech flow: Clear and Coherent   Thought content:  Appropriate to Mood and Circumstances   Preoccupation:  None   Hallucinations:  None   Organization:  No data recorded  [employer] of Knowledge:  Average   Intelligence:  Average   Abstraction:  Normal   Judgement:  Normal   Reality Testing:  Realistic   Insight:  Gaps   Decision Making:  Normal   Social Functioning  Social Maturity:  Isolates   Social Judgement:  Normal   Stress  Stressors:  Family conflict; Financial; Work   Coping Ability:  Human resources officer Deficits:  Interpersonal   Supports:  Family; Support needed     Leisure/Recreation: Leisure / Recreation Do You Have Hobbies?: Yes Leisure and Hobbies: watching movies  Exercise/Diet: Exercise/Diet Do You Have Any Trouble Sleeping?: Yes Explanation of Sleeping Difficulties: 4-6 hours q hs   CCA Employment/Education Employment/Work Situation: Employment / Work Situation Describe how patient's job has been impacted: Pt had Covid almost 2 weeks ago & was unable to stay at his job at [employer] of 2 weeks. He states he may be able to return  Education: Education Is Patient Currently Attending School?: No Did Garment/textile technologist From [employer]?: No Did You Product manager?: No Did You Attend Graduate School?: No   CCA Family/Childhood History Family and Relationship History: Family history Does patient have children?: Yes How many children?: 5  Childhood History:  Childhood History Did patient suffer any verbal/emotional/physical/sexual abuse as a child?: No Did patient suffer from severe childhood neglect?: No Has patient ever been sexually abused/assaulted/raped as an adolescent or adult?: No Witnessed domestic violence?: No Has patient been affected by domestic  violence as an adult?: No   CCA Substance Use Alcohol/Drug Use: Alcohol / Drug Use Pain Medications: SEE MAR Prescriptions: pt denies Over the Counter: SEE MAR History of alcohol / drug use?: Yes Substance #1 Name of Substance 1: alcohol 1 - Amount (size/oz): 4-6 beers 1 - Frequency: 3-4x weekly 1 - Duration: ongoing with 2 years of sobriety        ASAM's:  Six Dimensions of Multidimensional Assessment  Dimension 1:  Acute Intoxication and/or Withdrawal Potential:   Dimension 1:  Description of individual's past and current experiences of substance use and withdrawal: denies shakes & w/d sx  Dimension 2:  Biomedical Conditions and Complications:   Dimension 2:  Description of patient's biomedical conditions and  complications: Able to continue to work  Dimension 3:  Emotional, Research scientist (physical sciences), or Cognitive Conditions and Complications:  Dimension 3:  Description of emotional, behavioral, or cognitive conditions and complications: depression sx with recent suicide attempt  Dimension 4:  Readiness to Change:  Dimension 4:  Description of Readiness to Change criteria: Pt denied then minimized alcohol use. Niece stated pt's mother wanted to be sure alcohol use was discussed. States he will see an outpt counselor  Dimension 5:  Relapse, Continued use, or Continued Problem Potential:  Dimension 5:  Relapse, continued use, or continued problem potential critiera description: Does not connect depressed mood with substance abuse  Dimension 6:  Recovery/Living Environment:  Dimension 6:  Recovery/Iiving environment criteria description: Pt states he is able to stay with his mother  ASAM Severity Score: ASAM's Severity Rating Score: 8  ASAM Recommended Level of Treatment: ASAM Recommended Level of Treatment: Level II Intensive Outpatient Treatment   Substance use Disorder (SUD) Substance Use Disorder (SUD)  Checklist Symptoms of Substance Use: Continued use despite persistent or recurrent social,  interpersonal problems, caused or exacerbated by use,Persistent desire or unsuccessful efforts to cut down or control use,Substance(s) often taken in larger amounts or over longer times than was intended,Continued use despite having a persistent/recurrent physical/psychological problem caused/exacerbated by use  Recommendations for Services/Supports/Treatments: Recommendations for Services/Supports/Treatments Recommendations For Services/Supports/Treatments: CD-IOP Intensive Chemical Dependency Program  DSM5 Diagnoses: There are no problems to display for this patient.   Patient Centered Plan: Patient is on the following Treatment Plan(s):  Depression and Substance Abuse    Eleesha Purkey Suzan Nailer, LCSW

## 2021-01-23 NOTE — Progress Notes (Signed)
Blake Bishop received his AVS, questions answered and he was escorted to the lobby after retrieving his personal belongings.

## 2021-01-23 NOTE — B[facility] Counselor (Signed)
TTS triage: Patient presents to [facility] seeking counseling. He is tearful on arrival. Patient states he attempted to hang himself last Friday. States has many life stressors. He denies current SI/HI/AVH. He denies any substance use or medical complaints.  Patient is URGENT.

## 2021-01-23 NOTE — Discharge Instructions (Addendum)
° °  Please come to [facility] (this facility) during walk in hours for appointment with psychiatrist for further medication management and for therapists for therapy.  ° ° Walk in hours are 8-11 AM Monday through Thursday for medication management.Child and adolescent psychiatrists are only available on Wednesdays and Thursdays during walk in hours.  °Therapy walk in hours are Monday-Wednesday 8 AM-1PM.   It is first come, first -serve; it is best to arrive by 7:00 AM.  ° °On Friday from 1 pm to 4 pm for therapy intake only. Please arrive by 12:00 pm as it is  first come, first -serve.   ° °When you arrive please go upstairs for your appointment. If you are unsure of where to go, inform the front desk that you are here for a walk in appointment and they will assist you with directions upstairs. ° °Address:  °[address], in [place], [address] °Ph: [phone]  ° °

## 2021-03-17 ENCOUNTER — Emergency Department (HOSPITAL_COMMUNITY): Payer: Self-pay

## 2021-03-17 ENCOUNTER — Other Ambulatory Visit: Payer: Self-pay

## 2021-03-17 ENCOUNTER — Inpatient Hospital Stay (HOSPITAL_COMMUNITY)
Admission: EM | Admit: 2021-03-17 | Discharge: 2021-03-19 | DRG: 200 | Disposition: A | Discharge status: Home / Self Care | Payer: Self-pay | Attending: Physician Assistant | Admitting: Physician Assistant

## 2021-03-17 ENCOUNTER — Encounter (HOSPITAL_COMMUNITY): Payer: Self-pay

## 2021-03-17 DIAGNOSIS — Y9383 Activity, rough housing and horseplay: Secondary | ICD-10-CM

## 2021-03-17 DIAGNOSIS — M79601 Pain in right arm: Secondary | ICD-10-CM

## 2021-03-17 DIAGNOSIS — I10 Essential (primary) hypertension: Secondary | ICD-10-CM | POA: Diagnosis present

## 2021-03-17 DIAGNOSIS — W1830XA Fall on same level, unspecified, initial encounter: Secondary | ICD-10-CM | POA: Diagnosis present

## 2021-03-17 DIAGNOSIS — F17210 Nicotine dependence, cigarettes, uncomplicated: Secondary | ICD-10-CM | POA: Diagnosis present

## 2021-03-17 DIAGNOSIS — S2249XA Multiple fractures of ribs, unspecified side, initial encounter for closed fracture: Secondary | ICD-10-CM | POA: Diagnosis present

## 2021-03-17 DIAGNOSIS — F1010 Alcohol abuse, uncomplicated: Secondary | ICD-10-CM | POA: Diagnosis present

## 2021-03-17 DIAGNOSIS — S270XXA Traumatic pneumothorax, initial encounter: Principal | ICD-10-CM | POA: Diagnosis present

## 2021-03-17 DIAGNOSIS — S2231XA Fracture of one rib, right side, initial encounter for closed fracture: Secondary | ICD-10-CM | POA: Diagnosis present

## 2021-03-17 DIAGNOSIS — J939 Pneumothorax, unspecified: Secondary | ICD-10-CM

## 2021-03-17 DIAGNOSIS — S27321A Contusion of lung, unilateral, initial encounter: Secondary | ICD-10-CM | POA: Diagnosis present

## 2021-03-17 DIAGNOSIS — S42111A Displaced fracture of body of scapula, right shoulder, initial encounter for closed fracture: Secondary | ICD-10-CM | POA: Diagnosis present

## 2021-03-17 LAB — CBC WITH DIFFERENTIAL/PLATELET
Abs Immature Granulocytes: 0.03 10*3/uL (ref 0.00–0.07)
Basophils Absolute: 0 10*3/uL (ref 0.0–0.1)
Basophils Relative: 0 %
Eosinophils Absolute: 0 10*3/uL (ref 0.0–0.5)
Eosinophils Relative: 0 %
HCT: 45.1 % (ref 39.0–52.0)
Hemoglobin: 15.1 g/dL (ref 13.0–17.0)
Immature Granulocytes: 0 %
Lymphocytes Relative: 13 %
Lymphs Abs: 1.5 10*3/uL (ref 0.7–4.0)
MCH: 32.5 pg (ref 26.0–34.0)
MCHC: 33.5 g/dL (ref 30.0–36.0)
MCV: 97.2 fL (ref 80.0–100.0)
Monocytes Absolute: 0.6 10*3/uL (ref 0.1–1.0)
Monocytes Relative: 5 %
Neutro Abs: 9.9 10*3/uL — ABNORMAL HIGH (ref 1.7–7.7)
Neutrophils Relative %: 82 %
Platelets: 195 10*3/uL (ref 150–400)
RBC: 4.64 MIL/uL (ref 4.22–5.81)
RDW: 12 % (ref 11.5–15.5)
WBC: 12.1 10*3/uL — ABNORMAL HIGH (ref 4.0–10.5)
nRBC: 0 % (ref 0.0–0.2)

## 2021-03-17 LAB — BASIC METABOLIC PANEL
Anion gap: 12 (ref 5–15)
BUN: 7 mg/dL (ref 6–20)
CO2: 25 mmol/L (ref 22–32)
Calcium: 8.9 mg/dL (ref 8.9–10.3)
Chloride: 104 mmol/L (ref 98–111)
Creatinine, Ser: 0.85 mg/dL (ref 0.61–1.24)
GFR, Estimated: >60 mL/min (ref >60)
Glucose, Bld: 120 mg/dL — ABNORMAL HIGH (ref 70–99)
Potassium: 3.5 mmol/L (ref 3.5–5.1)
Sodium: 141 mmol/L (ref 135–145)

## 2021-03-17 LAB — MAGNESIUM: Magnesium: 1.5 mg/dL — ABNORMAL LOW (ref 1.7–2.4)

## 2021-03-17 LAB — PHOSPHORUS: Phosphorus: 2.6 mg/dL (ref 2.5–4.6)

## 2021-03-17 MED ORDER — HYDRALAZINE HCL 20 MG/ML IJ SOLN
10.0000 mg | INTRAMUSCULAR | Status: DC | PRN
  Filled 2021-03-17: qty 1

## 2021-03-17 MED ORDER — OXYCODONE HCL 5 MG PO TABS
5.0000 mg | ORAL_TABLET | Freq: Four times a day (QID) | ORAL | Status: DC | PRN
  Administered 2021-03-17 – 2021-03-18 (×3): 5 mg via ORAL
  Filled 2021-03-17 (×3): qty 2
  Filled 2021-03-17: qty 1

## 2021-03-17 MED ORDER — SODIUM CHLORIDE (PF) 0.9 % IJ SOLN
INTRAMUSCULAR | Status: AC
  Filled 2021-03-17: qty 50

## 2021-03-17 MED ORDER — HYDRALAZINE HCL 20 MG/ML IJ SOLN
10.0000 mg | INTRAMUSCULAR | Status: DC | PRN
  Administered 2021-03-17 (×2): 10 mg via INTRAVENOUS
  Filled 2021-03-17 (×2): qty 1

## 2021-03-17 MED ORDER — FOLIC ACID 1 MG PO TABS
1.0000 mg | ORAL_TABLET | Freq: Every day | ORAL | Status: DC
  Administered 2021-03-17 – 2021-03-19 (×3): 1 mg via ORAL
  Filled 2021-03-17 (×3): qty 1

## 2021-03-17 MED ORDER — ACETAMINOPHEN 500 MG PO TABS
1000.0000 mg | ORAL_TABLET | Freq: Four times a day (QID) | ORAL | Status: DC
  Administered 2021-03-17 – 2021-03-19 (×7): 1000 mg via ORAL
  Filled 2021-03-17 (×7): qty 2

## 2021-03-17 MED ORDER — THIAMINE HCL 100 MG PO TABS
100.0000 mg | ORAL_TABLET | Freq: Every day | ORAL | Status: DC
  Administered 2021-03-18 – 2021-03-19 (×2): 100 mg via ORAL
  Filled 2021-03-17 (×2): qty 1

## 2021-03-17 MED ORDER — SODIUM CHLORIDE 0.9 % IV BOLUS
1000.0000 mL | Freq: Once | INTRAVENOUS | Status: AC
  Administered 2021-03-17: 1000 mL via INTRAVENOUS

## 2021-03-17 MED ORDER — ENOXAPARIN SODIUM 30 MG/0.3ML IJ SOSY
30.0000 mg | PREFILLED_SYRINGE | Freq: Two times a day (BID) | INTRAMUSCULAR | Status: DC
  Administered 2021-03-18 – 2021-03-19 (×3): 30 mg via SUBCUTANEOUS
  Filled 2021-03-17 (×3): qty 0.3

## 2021-03-17 MED ORDER — IOHEXOL 300 MG/ML  SOLN
60.0000 mL | Freq: Once | INTRAMUSCULAR | Status: AC | PRN
  Administered 2021-03-17: 60 mL via INTRAVENOUS

## 2021-03-17 MED ORDER — HYDROMORPHONE HCL 1 MG/ML IJ SOLN
0.5000 mg | Freq: Once | INTRAMUSCULAR | Status: AC
  Administered 2021-03-17: 0.5 mg via INTRAVENOUS
  Filled 2021-03-17: qty 1

## 2021-03-17 MED ORDER — LORAZEPAM 1 MG PO TABS: 1.0000 mg | ORAL_TABLET | ORAL | Status: DC | PRN

## 2021-03-17 MED ORDER — ONDANSETRON HCL 4 MG/2ML IJ SOLN: 4.0000 mg | Freq: Four times a day (QID) | INTRAMUSCULAR | Status: DC | PRN

## 2021-03-17 MED ORDER — LORAZEPAM 2 MG/ML IJ SOLN: 1.0000 mg | INTRAMUSCULAR | Status: DC | PRN

## 2021-03-17 MED ORDER — IBUPROFEN 600 MG PO TABS
600.0000 mg | ORAL_TABLET | Freq: Four times a day (QID) | ORAL | Status: DC | PRN
  Administered 2021-03-17 – 2021-03-19 (×3): 600 mg via ORAL
  Filled 2021-03-17 (×3): qty 1

## 2021-03-17 MED ORDER — HYDROMORPHONE HCL 1 MG/ML IJ SOLN
0.5000 mg | INTRAMUSCULAR | Status: DC | PRN
Start: 2021-03-17 — End: 2021-03-19

## 2021-03-17 MED ORDER — THIAMINE HCL 100 MG/ML IJ SOLN
100.0000 mg | Freq: Every day | INTRAMUSCULAR | Status: DC
  Administered 2021-03-17: 100 mg via INTRAVENOUS
  Filled 2021-03-17: qty 2

## 2021-03-17 MED ORDER — LACTATED RINGERS IV SOLN: INTRAVENOUS | Status: DC

## 2021-03-17 MED ORDER — ADULT MULTIVITAMIN W/MINERALS CH
1.0000 | ORAL_TABLET | Freq: Every day | ORAL | Status: DC
  Administered 2021-03-17 – 2021-03-19 (×3): 1 via ORAL
  Filled 2021-03-17 (×3): qty 1

## 2021-03-17 MED ORDER — ONDANSETRON 4 MG PO TBDP: 4.0000 mg | ORAL_TABLET | Freq: Four times a day (QID) | ORAL | Status: DC | PRN

## 2021-03-17 MED ORDER — DOCUSATE SODIUM 100 MG PO CAPS
100.0000 mg | ORAL_CAPSULE | Freq: Two times a day (BID) | ORAL | Status: DC
  Filled 2021-03-17 (×5): qty 1

## 2021-03-17 NOTE — H&P (Signed)
CC:   HPI: Blake Bishop is an 45 y.o. male whom reports no prior medical history presented to [facility] ED overnight for evaluation.  He reports that he was horse playing with a family member earlier in the evening on 03/16/2021 and was taken down while wrestling.  He developed right upper back/chest wall pain and therefore presented to the emergency room for further evaluation.  He denies any loss of consciousness.  He underwent work-up in the ER and we were subsequently asked to see.  Currently, denies any pain in his head, neck, left side of his back, anterior chest, abdomen/pelvis.  He denies any pain in any extremity.  He has been ambulatory on his own.  PMH: Denies any prior medical history  Family hx: Unknown per patient  Social:  reports that he has been smoking cigarettes. He has been smoking an average of 1.00 packs per day. He has never used smokeless tobacco. He reports current alcohol use. He reports that he does not use drugs. He reports he is not currently working  Allergies: No Known Allergies  Medications: I have reviewed the patient's current medications.  Results for orders placed or performed during the hospital encounter of 03/17/21 (from the past 48 hour(s))  CBC with Differential/Platelet     Status: Abnormal   Collection Time: 03/17/21  4:36 AM  Result Value Ref Range   WBC 12.1 (H) 4.0 - 10.5 K/uL   RBC 4.64 4.22 - 5.81 MIL/uL   Hemoglobin 15.1 13.0 - 17.0 g/dL   HCT 91.4 78.2 - 95.6 %   MCV 97.2 80.0 - 100.0 fL   MCH 32.5 26.0 - 34.0 pg   MCHC 33.5 30.0 - 36.0 g/dL   RDW 21.3 08.6 - 57.8 %   Platelets 195 150 - 400 K/uL   nRBC 0.0 0.0 - 0.2 %   Neutrophils Relative % 82 %   Neutro Abs 9.9 (H) 1.7 - 7.7 K/uL   Lymphocytes Relative 13 %   Lymphs Abs 1.5 0.7 - 4.0 K/uL   Monocytes Relative 5 %   Monocytes Absolute 0.6 0.1 - 1.0 K/uL   Eosinophils Relative 0 %   Eosinophils Absolute 0.0 0.0 - 0.5 K/uL   Basophils Relative 0 %   Basophils Absolute 0.0 0.0  - 0.1 K/uL   Immature Granulocytes 0 %   Abs Immature Granulocytes 0.03 0.00 - 0.07 K/uL    Comment: Performed at [facility] [address] [place], [place] [address]  Basic metabolic panel     Status: Abnormal   Collection Time: 03/17/21  4:36 AM  Result Value Ref Range   Sodium 141 135 - 145 mmol/L   Potassium 3.5 3.5 - 5.1 mmol/L   Chloride 104 98 - 111 mmol/L   CO2 25 22 - 32 mmol/L   Glucose, Bld 120 (H) 70 - 99 mg/dL    Comment: Glucose reference range applies only to samples taken after fasting for at least 8 hours.   BUN 7 6 - 20 mg/dL   Creatinine, Ser 9.52 0.61 - 1.24 mg/dL   Calcium 8.9 8.9 - 84.1 mg/dL   GFR, Estimated >32 >44 mL/min    Comment: (NOTE) Calculated using the CKD-EPI Creatinine Equation (2021)    Anion gap 12 5 - 15    Comment: Performed at [facility], [address], [place], [place] [address]    DG Sternum  Addendum Date: 03/17/2021   ADDENDUM REPORT: 03/17/2021 04:30 ADDENDUM: These  results were called by telephone at the time of interpretation on 03/17/2021 at 4:27 am to provider [facility] , who verbally acknowledged these results. Electronically Signed   By: Tish Frederickson M.D.   On: 03/17/2021 04:30   Result Date: 03/17/2021 CLINICAL DATA:  Right upper arm pain and mild central chest pain. Intoxicated status post wrestling status post fall EXAM: RIGHT HUMERUS - 2+ VIEW; STERNUM - 2+ VIEW COMPARISON:  None. FINDINGS: Right humerus: Likely right scapular fracture. No other acute displaced fracture of the bones of the right shoulder. No right shoulder dislocation. No acute displaced fracture of the right humerus. Visualized portions of the right elbow are unremarkable. Minimally displaced fracture of the posterior right fourth rib. The heart size and mediastinal contours are within normal limits. No focal consolidation. No pulmonary edema. No pleural effusion. Trace to small volume right pneumothorax. No left  pneumothorax. Soft tissues are unremarkable. IMPRESSION: 1. Likely right scapular fracture. 2. Minimally displaced fracture of the posterior right fourth rib. 3. Associated trace to small volume right pneumothorax. 4. No right humeral fracture. 5. Limited evaluation for acute sternal fracture. Electronically Signed: By: Tish Frederickson M.D. On: 03/17/2021 04:24   CT Chest W Contrast  Result Date: 03/17/2021 CLINICAL DATA:  45 year old male status post blunt trauma, fall. Right scapula and rib fractures on radiographs today. EXAM: CT CHEST WITH CONTRAST TECHNIQUE: Multidetector CT imaging of the chest was performed during intravenous contrast administration. CONTRAST:  30mL OMNIPAQUE IOHEXOL 300 MG/ML  SOLN COMPARISON:  Right upper extremity radiographs 0358 hours. FINDINGS: Cardiovascular: Mild cardiac pulsation. Normal thoracic aorta. No cardiomegaly or pericardial effusion. Mediastinum/Nodes: Negative. No mediastinal hematoma or lymphadenopathy. Lungs/Pleura: Small pneumothorax (series 5, image 78). In the posterior right upper lobe a 3 cm area of pulmonary contusion and possible mild pulmonary laceration is noted on series 5, image 55. Less likely, this could be contusion superimposed on chronic emphysema. Superimposed symmetric dependent atelectasis in both lungs. No hemothorax or pleural fluid identified. Major airways are patent. Upper Abdomen: Negative visible liver, gallbladder, spleen, pancreas, adrenal glands, kidneys and bowel in the upper abdomen. Musculoskeletal: Intact sternum. Thoracic vertebrae appear intact. Visible left shoulder osseous structures and left ribs appear intact. Comminuted and mildly displaced fracture through the body of the right scapula (series 5, image 47). Normal glenohumeral alignment. Other visible right shoulder osseous structures are intact. Minimally displaced posterior right 4th rib fracture on series 5, image 41. This is underlying the scapula fracture. No other right  rib fracture. IMPRESSION: 1. Small right pneumothorax. Mild posterior right upper lobe pulmonary contusion with small adjacent lung laceration versus underlying emphysema. No hemothorax, pleural fluid. 2. Minimally displaced posterior right 4th rib fracture underlying a comminuted and mildly displaced fracture through the body of the right scapula. 3. No other acute traumatic injury identified in the chest. Electronically Signed   By: Odessa Fleming M.D.   On: 03/17/2021 07:02   DG Humerus Right  Addendum Date: 03/17/2021   ADDENDUM REPORT: 03/17/2021 04:30 ADDENDUM: These results were called by telephone at the time of interpretation on 03/17/2021 at 4:27 am to provider [facility] , who verbally acknowledged these results. Electronically Signed   By: Tish Frederickson M.D.   On: 03/17/2021 04:30   Result Date: 03/17/2021 CLINICAL DATA:  Right upper arm pain and mild central chest pain. Intoxicated status post wrestling status post fall EXAM: RIGHT HUMERUS - 2+ VIEW; STERNUM - 2+ VIEW COMPARISON:  None. FINDINGS: Right humerus: Likely right scapular fracture.  No other acute displaced fracture of the bones of the right shoulder. No right shoulder dislocation. No acute displaced fracture of the right humerus. Visualized portions of the right elbow are unremarkable. Minimally displaced fracture of the posterior right fourth rib. The heart size and mediastinal contours are within normal limits. No focal consolidation. No pulmonary edema. No pleural effusion. Trace to small volume right pneumothorax. No left pneumothorax. Soft tissues are unremarkable. IMPRESSION: 1. Likely right scapular fracture. 2. Minimally displaced fracture of the posterior right fourth rib. 3. Associated trace to small volume right pneumothorax. 4. No right humeral fracture. 5. Limited evaluation for acute sternal fracture. Electronically Signed: By: Tish FredericksonMorgane  Naveau M.D. On: 03/17/2021 04:24    ROS - All of the below systems have been reviewed with  the patient and positives are indicated with bold text General: chills, fever or night sweats Eyes: blurry vision or double vision ENT: epistaxis or sore throat Allergy/Immunology: itchy/watery eyes or nasal congestion Hematologic/Lymphatic: bleeding problems, blood clots or swollen lymph nodes Endocrine: temperature intolerance or unexpected weight changes Breast: new or changing breast lumps or nipple discharge Resp: cough, shortness of breath, or wheezing CV: chest pain (but does have chest wall pain with deep inspiration) or dyspnea on exertion GI: as per HPI GU: dysuria, trouble voiding, or hematuria MSK: joint pain or joint stiffness Neuro: TIA or stroke symptoms Derm: pruritus and skin lesion changes Psych: anxiety and depression  PE Blood pressure (!) 161/113, pulse 94, temperature 98.1 F (36.7 C), resp. rate 16, height 5\' 6"  (1.676 m), weight 63.5 kg, SpO2 97 %. Physical Exam Constitutional: NAD; conversant; no deformities; sitting in bed with legs crossed Eyes: Moist conjunctiva; no lid lag; anicteric; PERRL Neck: Trachea midline; no thyromegaly Lungs: Normal respiratory effort; CTAB; no tactile fremitus CV: RRR; no palpable thrills; no pitting edema GI: Abd soft, nontender, nondistended; no palpable hepatosplenomegaly MSK: Normal range of motion of extremities; no clubbing/cyanosis; no deformities; +right chest wall pain to palpation posteriorly; no tenderness anteriorly Psychiatric: Appropriate affect; alert and oriented x3 Lymphatic: No palpable cervical or axillary lymphadenopathy  Results for orders placed or performed during the hospital encounter of 03/17/21 (from the past 48 hour(s))  CBC with Differential/Platelet     Status: Abnormal   Collection Time: 03/17/21  4:36 AM  Result Value Ref Range   WBC 12.1 (H) 4.0 - 10.5 K/uL   RBC 4.64 4.22 - 5.81 MIL/uL   Hemoglobin 15.1 13.0 - 17.0 g/dL   HCT 95.645.1 21.339.0 - 08.652.0 %   MCV 97.2 80.0 - 100.0 fL   MCH 32.5 26.0 -  34.0 pg   MCHC 33.5 30.0 - 36.0 g/dL   RDW 57.812.0 46.911.5 - 62.915.5 %   Platelets 195 150 - 400 K/uL   nRBC 0.0 0.0 - 0.2 %   Neutrophils Relative % 82 %   Neutro Abs 9.9 (H) 1.7 - 7.7 K/uL   Lymphocytes Relative 13 %   Lymphs Abs 1.5 0.7 - 4.0 K/uL   Monocytes Relative 5 %   Monocytes Absolute 0.6 0.1 - 1.0 K/uL   Eosinophils Relative 0 %   Eosinophils Absolute 0.0 0.0 - 0.5 K/uL   Basophils Relative 0 %   Basophils Absolute 0.0 0.0 - 0.1 K/uL   Immature Granulocytes 0 %   Abs Immature Granulocytes 0.03 0.00 - 0.07 K/uL    Comment: Performed at [facility], [address] [place], [place] [id number]  Basic metabolic panel     Status: Abnormal  Collection Time: 03/17/21  4:36 AM  Result Value Ref Range   Sodium 141 135 - 145 mmol/L   Potassium 3.5 3.5 - 5.1 mmol/L   Chloride 104 98 - 111 mmol/L   CO2 25 22 - 32 mmol/L   Glucose, Bld 120 (H) 70 - 99 mg/dL    Comment: Glucose reference range applies only to samples taken after fasting for at least 8 hours.   BUN 7 6 - 20 mg/dL   Creatinine, Ser 1.19 0.61 - 1.24 mg/dL   Calcium 8.9 8.9 - 14.7 mg/dL   GFR, Estimated >82 >95 mL/min    Comment: (NOTE) Calculated using the CKD-EPI Creatinine Equation (2021)    Anion gap 12 5 - 15    Comment: Performed at [facility], [address], [place], [place] [address]    DG Sternum  Addendum Date: 03/17/2021   ADDENDUM REPORT: 03/17/2021 04:30 ADDENDUM: These results were called by telephone at the time of interpretation on 03/17/2021 at 4:27 am to provider [facility] , who verbally acknowledged these results. Electronically Signed   By: Tish Frederickson M.D.   On: 03/17/2021 04:30   Result Date: 03/17/2021 CLINICAL DATA:  Right upper arm pain and mild central chest pain. Intoxicated status post wrestling status post fall EXAM: RIGHT HUMERUS - 2+ VIEW; STERNUM - 2+ VIEW COMPARISON:  None. FINDINGS: Right humerus: Likely right scapular fracture. No other acute  displaced fracture of the bones of the right shoulder. No right shoulder dislocation. No acute displaced fracture of the right humerus. Visualized portions of the right elbow are unremarkable. Minimally displaced fracture of the posterior right fourth rib. The heart size and mediastinal contours are within normal limits. No focal consolidation. No pulmonary edema. No pleural effusion. Trace to small volume right pneumothorax. No left pneumothorax. Soft tissues are unremarkable. IMPRESSION: 1. Likely right scapular fracture. 2. Minimally displaced fracture of the posterior right fourth rib. 3. Associated trace to small volume right pneumothorax. 4. No right humeral fracture. 5. Limited evaluation for acute sternal fracture. Electronically Signed: By: Tish Frederickson M.D. On: 03/17/2021 04:24   CT Chest W Contrast  Result Date: 03/17/2021 CLINICAL DATA:  45 year old male status post blunt trauma, fall. Right scapula and rib fractures on radiographs today. EXAM: CT CHEST WITH CONTRAST TECHNIQUE: Multidetector CT imaging of the chest was performed during intravenous contrast administration. CONTRAST:  77mL OMNIPAQUE IOHEXOL 300 MG/ML  SOLN COMPARISON:  Right upper extremity radiographs 0358 hours. FINDINGS: Cardiovascular: Mild cardiac pulsation. Normal thoracic aorta. No cardiomegaly or pericardial effusion. Mediastinum/Nodes: Negative. No mediastinal hematoma or lymphadenopathy. Lungs/Pleura: Small pneumothorax (series 5, image 78). In the posterior right upper lobe a 3 cm area of pulmonary contusion and possible mild pulmonary laceration is noted on series 5, image 55. Less likely, this could be contusion superimposed on chronic emphysema. Superimposed symmetric dependent atelectasis in both lungs. No hemothorax or pleural fluid identified. Major airways are patent. Upper Abdomen: Negative visible liver, gallbladder, spleen, pancreas, adrenal glands, kidneys and bowel in the upper abdomen. Musculoskeletal: Intact  sternum. Thoracic vertebrae appear intact. Visible left shoulder osseous structures and left ribs appear intact. Comminuted and mildly displaced fracture through the body of the right scapula (series 5, image 47). Normal glenohumeral alignment. Other visible right shoulder osseous structures are intact. Minimally displaced posterior right 4th rib fracture on series 5, image 41. This is underlying the scapula fracture. No other right rib fracture. IMPRESSION: 1. Small right pneumothorax. Mild posterior right upper lobe pulmonary  contusion with small adjacent lung laceration versus underlying emphysema. No hemothorax, pleural fluid. 2. Minimally displaced posterior right 4th rib fracture underlying a comminuted and mildly displaced fracture through the body of the right scapula. 3. No other acute traumatic injury identified in the chest. Electronically Signed   By: Odessa Fleming M.D.   On: 03/17/2021 07:02   DG Humerus Right  Addendum Date: 03/17/2021   ADDENDUM REPORT: 03/17/2021 04:30 ADDENDUM: These results were called by telephone at the time of interpretation on 03/17/2021 at 4:27 am to provider [facility] , who verbally acknowledged these results. Electronically Signed   By: Tish Frederickson M.D.   On: 03/17/2021 04:30   Result Date: 03/17/2021 CLINICAL DATA:  Right upper arm pain and mild central chest pain. Intoxicated status post wrestling status post fall EXAM: RIGHT HUMERUS - 2+ VIEW; STERNUM - 2+ VIEW COMPARISON:  None. FINDINGS: Right humerus: Likely right scapular fracture. No other acute displaced fracture of the bones of the right shoulder. No right shoulder dislocation. No acute displaced fracture of the right humerus. Visualized portions of the right elbow are unremarkable. Minimally displaced fracture of the posterior right fourth rib. The heart size and mediastinal contours are within normal limits. No focal consolidation. No pulmonary edema. No pleural effusion. Trace to small volume right  pneumothorax. No left pneumothorax. Soft tissues are unremarkable. IMPRESSION: 1. Likely right scapular fracture. 2. Minimally displaced fracture of the posterior right fourth rib. 3. Associated trace to small volume right pneumothorax. 4. No right humeral fracture. 5. Limited evaluation for acute sternal fracture. Electronically Signed: By: Tish Frederickson M.D. On: 03/17/2021 04:24    Assessment/Plan: 44yoM s/p mechanical fall/wrestling with family  R posterior 4th rib fx + occult ptx and mild R lung ctx: multimodal pain control; repeat CXR in AM to assess for stability Comminuted R scapula - orthopedics consulted by Dr. Eudelia Bunch @ ~730am; they plan no procedure this admission and will see in 2 weeks in their office per their notes. Dispo - admit to [facility] for further monitoring, repeat CXR in AM - further plans based on clinical course  Marin Olp, MD [facility] Use [email or web address] to contact on call provider

## 2021-03-17 NOTE — Consult Note (Signed)
Patient ID: Blake Bishop MRN: [id number] DOB/AGE: Apr 10, 1976 45 y.o.  Admit date: 03/17/2021  Admission Diagnoses:  Active Problems:   * No active hospital problems. *   HPI: Shoulder Injury This is a new problem. The current episode started overnight. The problem occurs constantly. The problem has been gradually worsening. Associated symptoms include chest pain (anterior chest wall). Pertinent negatives include no abdominal pain, no headaches and no shortness of breath. Exacerbated by: movement. Relieved by: immobility. He has tried nothing for the symptoms.    Patient reports that he had a mechanical fall while wrestling (horse-playing) with family members earlier this evening.  Patient reports that his uncle who is heavyset landed on top of him.Initially had mild pain.  Pain was severe after waking up this evening.  Orthopedics consulted for right scapula fracture.   Past Medical History: History reviewed. No pertinent past medical history.  Surgical History: History reviewed. No pertinent surgical history.  Family History: No family history on file.  Social History: Social History   Socioeconomic History   Marital status: Single    Spouse name: Not on file   Number of children: Not on file   Years of education: Not on file   Highest education level: Not on file  Occupational History   Not on file  Tobacco Use   Smoking status: Every Day    Packs/day: 1.00    Pack years: 0.00    Types: Cigarettes   Smokeless tobacco: Never  Substance and Sexual Activity   Alcohol use: Yes    Comment: every other day   Drug use: No   Sexual activity: Not on file  Other Topics Concern   Not on file  Social History Narrative   Not on file   Social Determinants of Health   Financial Resource Strain: Not on file  Food Insecurity: Not on file  Transportation Needs: Not on file  Physical Activity: Not on file  Stress: Not on file  Social Connections: Not on file   Intimate Partner Violence: Not on file    Allergies: Patient has no known allergies.  Medications: I have reviewed the patient's current medications.  Vital Signs: Patient Vitals for the past 24 hrs:  BP Temp Pulse Resp SpO2 Height Weight  03/17/21 0915 (!) 169/116 -- (!) 107 16 96 % -- --  03/17/21 0900 (!) 169/116 -- (!) 101 (!) 9 92 % -- --  03/17/21 0815 (!) 166/121 -- 89 16 96 % -- --  03/17/21 0700 (!) 161/113 -- 94 16 97 % -- --  03/17/21 0615 (!) 161/113 -- 94 15 97 % -- --  03/17/21 0600 (!) 169/113 -- 99 14 99 % -- --  03/17/21 0445 (!) 154/101 -- (!) 107 (!) 25 97 % -- --  03/17/21 0330 (!) 134/97 -- 99 15 98 % -- --  03/17/21 0316 (!) 137/100 98.1 F (36.7 C) 100 18 99 % -- --  03/17/21 0307 -- -- -- -- -- 5\' 6"  (1.676 m) 63.5 kg    Radiology: DG Sternum  Addendum Date: 03/17/2021   ADDENDUM REPORT: 03/17/2021 04:30 ADDENDUM: These results were called by telephone at the time of interpretation on 03/17/2021 at 4:27 am to provider [facility] , who verbally acknowledged these results. Electronically Signed   By: [facility] M.D.   On: 03/17/2021 04:30   Result Date: 03/17/2021 CLINICAL DATA:  Right upper arm pain and mild central chest pain. Intoxicated status post wrestling status post  fall EXAM: RIGHT HUMERUS - 2+ VIEW; STERNUM - 2+ VIEW COMPARISON:  None. FINDINGS: Right humerus: Likely right scapular fracture. No other acute displaced fracture of the bones of the right shoulder. No right shoulder dislocation. No acute displaced fracture of the right humerus. Visualized portions of the right elbow are unremarkable. Minimally displaced fracture of the posterior right fourth rib. The heart size and mediastinal contours are within normal limits. No focal consolidation. No pulmonary edema. No pleural effusion. Trace to small volume right pneumothorax. No left pneumothorax. Soft tissues are unremarkable. IMPRESSION: 1. Likely right scapular fracture. 2. Minimally displaced  fracture of the posterior right fourth rib. 3. Associated trace to small volume right pneumothorax. 4. No right humeral fracture. 5. Limited evaluation for acute sternal fracture. Electronically Signed: By: Tish Frederickson M.D. On: 03/17/2021 04:24   CT Chest W Contrast  Result Date: 03/17/2021 CLINICAL DATA:  45 year old male status post blunt trauma, fall. Right scapula and rib fractures on radiographs today. EXAM: CT CHEST WITH CONTRAST TECHNIQUE: Multidetector CT imaging of the chest was performed during intravenous contrast administration. CONTRAST:  29mL OMNIPAQUE IOHEXOL 300 MG/ML  SOLN COMPARISON:  Right upper extremity radiographs 0358 hours. FINDINGS: Cardiovascular: Mild cardiac pulsation. Normal thoracic aorta. No cardiomegaly or pericardial effusion. Mediastinum/Nodes: Negative. No mediastinal hematoma or lymphadenopathy. Lungs/Pleura: Small pneumothorax (series 5, image 78). In the posterior right upper lobe a 3 cm area of pulmonary contusion and possible mild pulmonary laceration is noted on series 5, image 55. Less likely, this could be contusion superimposed on chronic emphysema. Superimposed symmetric dependent atelectasis in both lungs. No hemothorax or pleural fluid identified. Major airways are patent. Upper Abdomen: Negative visible liver, gallbladder, spleen, pancreas, adrenal glands, kidneys and bowel in the upper abdomen. Musculoskeletal: Intact sternum. Thoracic vertebrae appear intact. Visible left shoulder osseous structures and left ribs appear intact. Comminuted and mildly displaced fracture through the body of the right scapula (series 5, image 47). Normal glenohumeral alignment. Other visible right shoulder osseous structures are intact. Minimally displaced posterior right 4th rib fracture on series 5, image 41. This is underlying the scapula fracture. No other right rib fracture. IMPRESSION: 1. Small right pneumothorax. Mild posterior right upper lobe pulmonary contusion with  small adjacent lung laceration versus underlying emphysema. No hemothorax, pleural fluid. 2. Minimally displaced posterior right 4th rib fracture underlying a comminuted and mildly displaced fracture through the body of the right scapula. 3. No other acute traumatic injury identified in the chest. Electronically Signed   By: Odessa Fleming M.D.   On: 03/17/2021 07:02   DG Humerus Right  Addendum Date: 03/17/2021   ADDENDUM REPORT: 03/17/2021 04:30 ADDENDUM: These results were called by telephone at the time of interpretation on 03/17/2021 at 4:27 am to provider [facility] , who verbally acknowledged these results. Electronically Signed   By: Tish Frederickson M.D.   On: 03/17/2021 04:30   Result Date: 03/17/2021 CLINICAL DATA:  Right upper arm pain and mild central chest pain. Intoxicated status post wrestling status post fall EXAM: RIGHT HUMERUS - 2+ VIEW; STERNUM - 2+ VIEW COMPARISON:  None. FINDINGS: Right humerus: Likely right scapular fracture. No other acute displaced fracture of the bones of the right shoulder. No right shoulder dislocation. No acute displaced fracture of the right humerus. Visualized portions of the right elbow are unremarkable. Minimally displaced fracture of the posterior right fourth rib. The heart size and mediastinal contours are within normal limits. No focal consolidation. No pulmonary edema. No pleural effusion. Trace to  small volume right pneumothorax. No left pneumothorax. Soft tissues are unremarkable. IMPRESSION: 1. Likely right scapular fracture. 2. Minimally displaced fracture of the posterior right fourth rib. 3. Associated trace to small volume right pneumothorax. 4. No right humeral fracture. 5. Limited evaluation for acute sternal fracture. Electronically Signed: By: Tish Frederickson M.D. On: 03/17/2021 04:24    Labs: Recent Labs    03/17/21 0436  WBC 12.1*  RBC 4.64  HCT 45.1  PLT 195   Recent Labs    03/17/21 0436  NA 141  K 3.5  CL 104  CO2 25  BUN 7   CREATININE 0.85  GLUCOSE 120*  CALCIUM 8.9   No results for input(s): LABPT, INR in the last 72 hours.  Review of Systems: Review of Systems Respiratory:  Negative for shortness of breath.   Cardiovascular:  Positive for chest pain (anterior chest wall). Gastrointestinal:  Negative for abdominal pain. Neurological:  Negative for headaches.  All other systems are reviewed and are negative for acute change except as noted in the HPI  Physical Exam: Body mass index is 22.6 kg/m.  AAOx3 Pain with ROM of right shoulder. TTP over right scapula.  Wrist and elbow ROM intact.  Distal sensation intact. Palpable radial pulses. Good cap refill.   Assessment and Plan: Right Scapula fracture. No surgical intervention recommended for this isolated issue at this time. We will place him in a sling to decrease ROM and improve comfort. He will F/u with Dr. Aundria Rud in 2 weeks.   Discussed with Dr. Shon Baton.   [facility] Mossberg PA-C [facility]

## 2021-03-17 NOTE — ED Notes (Signed)
Pt called out for bathroom assistance. Pt attempted to walk but was very unsteady. Pt complains of pain. W/c brought to pts room to assist with pt transfer. Pt asks if he can have anything for his pain. RN informed.

## 2021-03-17 NOTE — Progress Notes (Signed)
Orthopedic Tech Progress Note Patient Details:  Blake Bishop [birth year] [id number]  Ortho Devices Type of Ortho Device: Arm sling Ortho Device/Splint Location: right Ortho Device/Splint Interventions: Application   Post Interventions Patient Tolerated: Well Instructions Provided: Care of device  [name] 03/17/2021, 9:51 AM

## 2021-03-17 NOTE — ED Triage Notes (Signed)
Pt to Ed by EMS from  home with c/o R shoulder pain. Pt was intoxicated and wrestling with a family member when he fell on his R shoulder. States that it wasn't painful at the time, but as the alcohol has worn off, it has become very painful. Pt states he thinks it is dislocated, no deformity or swelling noted.

## 2021-03-17 NOTE — ED Provider Notes (Signed)
[facility]-EMERGENCY DEPT Provider Note  CSN: [id number] Arrival date & time: 03/17/21 3149  Chief Complaint(s) Shoulder Injury  HPI Blake Bishop is a 45 y.o. male   The history is provided by the patient.  Shoulder Injury This is a new problem. The current episode started 6 to 12 hours ago. The problem occurs constantly. The problem has been gradually worsening. Associated symptoms include chest pain (anterior chest wall). Pertinent negatives include no abdominal pain, no headaches and no shortness of breath. Exacerbated by: movement. Relieved by: immobility. He has tried nothing for the symptoms.   Patient reports that he had a mechanical fall while wrestling (horse-playing) with family members earlier this evening.  Patient reports that his uncle who is heavyset landed on top of him.Initially had mild pain.  Pain was severe after waking up this evening.  Past Medical History History reviewed. No pertinent past medical history. There are no problems to display for this patient.  Home Medication(s) Prior to Admission medications   Not on File                                                                                                                                    Past Surgical History History reviewed. No pertinent surgical history. Family History No family history on file.  Social History Social History   Tobacco Use   Smoking status: Every Day    Packs/day: 1.00    Pack years: 0.00    Types: Cigarettes   Smokeless tobacco: Never  Substance Use Topics   Alcohol use: Yes    Comment: every other day   Drug use: No   Allergies Patient has no known allergies.  Review of Systems Review of Systems  Respiratory:  Negative for shortness of breath.   Cardiovascular:  Positive for chest pain (anterior chest wall).  Gastrointestinal:  Negative for abdominal pain.  Neurological:  Negative for headaches.  All other systems are reviewed and are  negative for acute change except as noted in the HPI  Physical Exam Vital Signs  I have reviewed the triage vital signs BP (!) 161/113   Pulse 94   Temp 98.1 F (36.7 C)   Resp 16   Ht 5\' 6"  (1.676 m)   Wt 63.5 kg   SpO2 97%   BMI 22.60 kg/m   Physical Exam Vitals reviewed.  Constitutional:      General: He is not in acute distress.    Appearance: He is well-developed. He is not diaphoretic.  HENT:     Head: Normocephalic and atraumatic.     Nose: Nose normal.  Eyes:     General: No scleral icterus.       Right eye: No discharge.        Left eye: No discharge.     Conjunctiva/sclera: Conjunctivae normal.     Pupils: Pupils are equal, round, and reactive to light.  Cardiovascular:     Rate and Rhythm: Normal rate and regular rhythm.     Heart sounds: No murmur heard.   No friction rub. No gallop.  Pulmonary:     Effort: Pulmonary effort is normal. No respiratory distress.     Breath sounds: Normal breath sounds. No stridor. No rales.  Chest:     Chest wall: Tenderness present.    Abdominal:     General: There is no distension.     Palpations: Abdomen is soft.     Tenderness: There is no abdominal tenderness.  Musculoskeletal:     Right shoulder: No swelling, deformity, tenderness or bony tenderness.     Right upper arm: Tenderness present. No swelling, edema or deformity.     Cervical back: Normal range of motion and neck supple. No spinous process tenderness or muscular tenderness.  Skin:    General: Skin is warm and dry.     Findings: No erythema or rash.  Neurological:     Mental Status: He is alert and oriented to person, place, and time.    ED Results and Treatments Labs (all labs ordered are listed, but only abnormal results are displayed) Labs Reviewed  CBC WITH DIFFERENTIAL/PLATELET - Abnormal; Notable for the following components:      Result Value   WBC 12.1 (*)    Neutro Abs 9.9 (*)    All other components within normal limits  BASIC  METABOLIC PANEL - Abnormal; Notable for the following components:   Glucose, Bld 120 (*)    All other components within normal limits                                                                                                                         EKG  EKG Interpretation  Date/Time:  Saturday March 17 2021 03:27:31 EDT Ventricular Rate:  99 PR Interval:  125 QRS Duration: 90 QT Interval:  337 QTC Calculation: 433 R Axis:   -29 Text Interpretation: Sinus rhythm Borderline left axis deviation Borderline T abnormalities, inferior leads NO STEMI No old tracing to compare Confirmed by Drema Pry [phone]) on 03/17/2021 3:38:21 AM        Radiology DG Sternum  Addendum Date: 03/17/2021   ADDENDUM REPORT: 03/17/2021 04:30 ADDENDUM: These results were called by telephone at the time of interpretation on 03/17/2021 at 4:27 am to provider [facility] , who verbally acknowledged these results. Electronically Signed   By: Tish Frederickson M.D.   On: 03/17/2021 04:30   Result Date: 03/17/2021 CLINICAL DATA:  Right upper arm pain and mild central chest pain. Intoxicated status post wrestling status post fall EXAM: RIGHT HUMERUS - 2+ VIEW; STERNUM - 2+ VIEW COMPARISON:  None. FINDINGS: Right humerus: Likely right scapular fracture. No other acute displaced fracture of the bones of the right shoulder. No right shoulder dislocation. No acute displaced fracture of the right humerus. Visualized portions of the right elbow are unremarkable. Minimally displaced fracture of the posterior right  fourth rib. The heart size and mediastinal contours are within normal limits. No focal consolidation. No pulmonary edema. No pleural effusion. Trace to small volume right pneumothorax. No left pneumothorax. Soft tissues are unremarkable. IMPRESSION: 1. Likely right scapular fracture. 2. Minimally displaced fracture of the posterior right fourth rib. 3. Associated trace to small volume right pneumothorax. 4. No right  humeral fracture. 5. Limited evaluation for acute sternal fracture. Electronically Signed: By: Tish FredericksonMorgane  Naveau M.D. On: 03/17/2021 04:24   CT Chest W Contrast  Result Date: 03/17/2021 CLINICAL DATA:  45 year old male status post blunt trauma, fall. Right scapula and rib fractures on radiographs today. EXAM: CT CHEST WITH CONTRAST TECHNIQUE: Multidetector CT imaging of the chest was performed during intravenous contrast administration. CONTRAST:  60mL OMNIPAQUE IOHEXOL 300 MG/ML  SOLN COMPARISON:  Right upper extremity radiographs 0358 hours. FINDINGS: Cardiovascular: Mild cardiac pulsation. Normal thoracic aorta. No cardiomegaly or pericardial effusion. Mediastinum/Nodes: Negative. No mediastinal hematoma or lymphadenopathy. Lungs/Pleura: Small pneumothorax (series 5, image 78). In the posterior right upper lobe a 3 cm area of pulmonary contusion and possible mild pulmonary laceration is noted on series 5, image 55. Less likely, this could be contusion superimposed on chronic emphysema. Superimposed symmetric dependent atelectasis in both lungs. No hemothorax or pleural fluid identified. Major airways are patent. Upper Abdomen: Negative visible liver, gallbladder, spleen, pancreas, adrenal glands, kidneys and bowel in the upper abdomen. Musculoskeletal: Intact sternum. Thoracic vertebrae appear intact. Visible left shoulder osseous structures and left ribs appear intact. Comminuted and mildly displaced fracture through the body of the right scapula (series 5, image 47). Normal glenohumeral alignment. Other visible right shoulder osseous structures are intact. Minimally displaced posterior right 4th rib fracture on series 5, image 41. This is underlying the scapula fracture. No other right rib fracture. IMPRESSION: 1. Small right pneumothorax. Mild posterior right upper lobe pulmonary contusion with small adjacent lung laceration versus underlying emphysema. No hemothorax, pleural fluid. 2. Minimally displaced  posterior right 4th rib fracture underlying a comminuted and mildly displaced fracture through the body of the right scapula. 3. No other acute traumatic injury identified in the chest. Electronically Signed   By: Odessa FlemingH  Hall M.D.   On: 03/17/2021 07:02   DG Humerus Right  Addendum Date: 03/17/2021   ADDENDUM REPORT: 03/17/2021 04:30 ADDENDUM: These results were called by telephone at the time of interpretation on 03/17/2021 at 4:27 am to provider [facility] Right[facility]er , who verbally acknowledged these results. Electronically Signed   By: Tish FredericksonMorgane  Naveau M.D.   On: 03/17/2021 04:30   Result Date: 03/17/2021 CLINICAL DATA:  Right upper arm pain and mild central chest pain. Intoxicated status post wrestling status post fall EXAM: RIGHT HUMERUS - 2+ VIEW; STERNUM - 2+ VIEW COMPARISON:  None. FINDINGS: Right humerus: Likely right scapular fracture. No other acute displaced fracture of the bones of the right shoulder. No right shoulder dislocation. No acute displaced fracture of the right humerus. Visualized portions of the right elbow are unremarkable. Minimally displaced fracture of the posterior right fourth rib. The heart size and mediastinal contours are within normal limits. No focal consolidation. No pulmonary edema. No pleural effusion. Trace to small volume right pneumothorax. No left pneumothorax. Soft tissues are unremarkable. IMPRESSION: 1. Likely right scapular fracture. 2. Minimally displaced fracture of the posterior right fourth rib. 3. Associated trace to small volume right pneumothorax. 4. No right humeral fracture. 5. Limited evaluation for acute sternal fracture. Electronically Signed: By: Tish FredericksonMorgane  Naveau M.D. On: 03/17/2021 04:24  Pertinent labs & imaging results that were available during my care of the patient were reviewed by me and considered in my medical decision making (see chart for details).  Medications Ordered in ED Medications  sodium chloride (PF) 0.9 % injection (has no administration in  time range)  HYDROmorphone (DILAUDID) injection 0.5 mg (has no administration in time range)  HYDROmorphone (DILAUDID) injection 0.5 mg (0.5 mg Intravenous Given 03/17/21 0458)  sodium chloride 0.9 % bolus 1,000 mL (0 mLs Intravenous Stopped 03/17/21 0703)  iohexol (OMNIPAQUE) 300 MG/ML solution 60 mL (60 mLs Intravenous Contrast Given 03/17/21 1245)                                                                                                                                    Procedures Procedures  (including critical care time)  Medical Decision Making / ED Course I have reviewed the nursing notes for this encounter and the patient's prior records (if available in EHR or on provided paperwork).   Blake Bishop was evaluated in Emergency Department on 03/17/2021 for the symptoms described in the history of present illness. He was evaluated in the context of the global COVID-19 pandemic, which necessitated consideration that the patient might be at risk for infection with the SARS-CoV-2 virus that causes COVID-19. Institutional protocols and algorithms that pertain to the evaluation of patients at risk for COVID-19 are in a state of rapid change based on information released by regulatory bodies including the CDC and federal and state organizations. These policies and algorithms were followed during the patient's care in the ED.  Right arm and anterior chest wall pain following blunt trauma while wrestling. No obvious deformities noted on exam. Plain film of the right humerus negative for acute fracture of the humerus itself but did reveal a posterior fourth rib fracture with a possible scapular fracture and small pneumothorax. Will obtain a CT scan to better characterize injuries. Screening labs obtained and grossly reassuring. EKG without acute ischemic changes.  CT images confirmed above findings. Patient will require admission to surgery/trauma service. Consulted with Dr. Cliffton Asters who will  coordinate admission and transferred to [facility] orthopedic surgery and spoke with Dr. Shon Baton who will evaluate the patient for his scapular fracture.      Final Clinical Impression(s) / ED Diagnoses Final diagnoses:  Right arm pain  Closed displaced fracture of body of right scapula, initial encounter  Closed fracture of one rib of right side, initial encounter  Pneumothorax on right      This chart was dictated using voice recognition software.  Despite best efforts to proofread,  errors can occur which can change the documentation meaning.    Nira Conn, MD 03/17/21 [phone]

## 2021-03-18 ENCOUNTER — Inpatient Hospital Stay (HOSPITAL_COMMUNITY): Payer: Self-pay

## 2021-03-18 LAB — COMPREHENSIVE METABOLIC PANEL
ALT: 30 U/L (ref 0–44)
AST: 28 U/L (ref 15–41)
Albumin: 3.4 g/dL — ABNORMAL LOW (ref 3.5–5.0)
Alkaline Phosphatase: 49 U/L (ref 38–126)
Anion gap: 8 (ref 5–15)
BUN: 7 mg/dL (ref 6–20)
CO2: 25 mmol/L (ref 22–32)
Calcium: 8.7 mg/dL — ABNORMAL LOW (ref 8.9–10.3)
Chloride: 103 mmol/L (ref 98–111)
Creatinine, Ser: 0.86 mg/dL (ref 0.61–1.24)
GFR, Estimated: >60 mL/min (ref >60)
Glucose, Bld: 106 mg/dL — ABNORMAL HIGH (ref 70–99)
Potassium: 2.9 mmol/L — ABNORMAL LOW (ref 3.5–5.1)
Sodium: 136 mmol/L (ref 135–145)
Total Bilirubin: 1.1 mg/dL (ref 0.3–1.2)
Total Protein: 6.6 g/dL (ref 6.5–8.1)

## 2021-03-18 LAB — HIV ANTIBODY (ROUTINE TESTING W REFLEX): HIV Screen 4th Generation wRfx: NONREACTIVE

## 2021-03-18 MED ORDER — METHOCARBAMOL 500 MG PO TABS
500.0000 mg | ORAL_TABLET | Freq: Three times a day (TID) | ORAL | Status: DC
  Administered 2021-03-18 – 2021-03-19 (×4): 500 mg via ORAL
  Filled 2021-03-18 (×4): qty 1

## 2021-03-18 NOTE — Progress Notes (Signed)
Progress Note     Subjective: Patient reports pain is well controlled currently. Denies SOB, has been using IS. He reports he drinks 4-5 12 oz beers every other day but would like to stop drinking.   Objective: Vital signs in last 24 hours: Temp:  [98 F (36.7 C)-98.2 F (36.8 C)] 98 F (36.7 C) (07/03 0611) Pulse Rate:  [67-114] 67 (07/03 0611) Resp:  [16-18] 18 (07/03 0611) BP: (149-177)/(100-124) 167/113 (07/03 0611) SpO2:  [98 %-100 %] 100 % (07/03 0611) Last BM Date: 03/16/21  Intake/Output from previous day: 07/02 0701 - 07/03 0700 In: 1000 [IV Piggyback:1000] Out: 200 [Urine:200] Intake/Output this shift: Total I/O In: 300 [P.O.:300] Out: -   PE: General: pleasant, WD, WN male who is laying in bed in NAD  Heart: regular, rate, and rhythm.  Normal s1,s2. No obvious murmurs, gallops, or rubs noted.  Palpable radial and pedal pulses bilaterally Lungs: CTAB, no wheezes, rhonchi, or rales noted.  Respiratory effort nonlabored Abd: soft, NT, ND, +BS, no masses, hernias, or organomegaly MS: RUE in sling, R hand NVI   Lab Results:  Recent Labs    03/17/21 0436  WBC 12.1*  HGB 15.1  HCT 45.1  PLT 195   BMET Recent Labs    03/17/21 0436 03/18/21 0035  NA 141 136  K 3.5 2.9*  CL 104 103  CO2 25 25  GLUCOSE 120* 106*  BUN 7 7  CREATININE 0.85 0.86  CALCIUM 8.9 8.7*   PT/INR No results for input(s): LABPROT, INR in the last 72 hours. CMP     Component Value Date/Time   NA 136 03/18/2021 0035   K 2.9 (L) 03/18/2021 0035   CL 103 03/18/2021 0035   CO2 25 03/18/2021 0035   GLUCOSE 106 (H) 03/18/2021 0035   BUN 7 03/18/2021 0035   CREATININE 0.86 03/18/2021 0035   CALCIUM 8.7 (L) 03/18/2021 0035   PROT 6.6 03/18/2021 0035   ALBUMIN 3.4 (L) 03/18/2021 0035   AST 28 03/18/2021 0035   ALT 30 03/18/2021 0035   ALKPHOS 49 03/18/2021 0035   BILITOT 1.1 03/18/2021 0035   GFRNONAA >60 03/18/2021 0035   Lipase  No results found for:  LIPASE     Studies/Results: DG Sternum  Addendum Date: 03/17/2021   ADDENDUM REPORT: 03/17/2021 04:30 ADDENDUM: These results were called by telephone at the time of interpretation on 03/17/2021 at 4:27 am to provider [facility] , who verbally acknowledged these results. Electronically Signed   By: Tish Frederickson M.D.   On: 03/17/2021 04:30   Result Date: 03/17/2021 CLINICAL DATA:  Right upper arm pain and mild central chest pain. Intoxicated status post wrestling status post fall EXAM: RIGHT HUMERUS - 2+ VIEW; STERNUM - 2+ VIEW COMPARISON:  None. FINDINGS: Right humerus: Likely right scapular fracture. No other acute displaced fracture of the bones of the right shoulder. No right shoulder dislocation. No acute displaced fracture of the right humerus. Visualized portions of the right elbow are unremarkable. Minimally displaced fracture of the posterior right fourth rib. The heart size and mediastinal contours are within normal limits. No focal consolidation. No pulmonary edema. No pleural effusion. Trace to small volume right pneumothorax. No left pneumothorax. Soft tissues are unremarkable. IMPRESSION: 1. Likely right scapular fracture. 2. Minimally displaced fracture of the posterior right fourth rib. 3. Associated trace to small volume right pneumothorax. 4. No right humeral fracture. 5. Limited evaluation for acute sternal fracture. Electronically Signed: By: Tish Frederickson M.D. On:  03/17/2021 04:24   CT Chest W Contrast  Result Date: 03/17/2021 CLINICAL DATA:  45 year old male status post blunt trauma, fall. Right scapula and rib fractures on radiographs today. EXAM: CT CHEST WITH CONTRAST TECHNIQUE: Multidetector CT imaging of the chest was performed during intravenous contrast administration. CONTRAST:  67mL OMNIPAQUE IOHEXOL 300 MG/ML  SOLN COMPARISON:  Right upper extremity radiographs 0358 hours. FINDINGS: Cardiovascular: Mild cardiac pulsation. Normal thoracic aorta. No cardiomegaly or  pericardial effusion. Mediastinum/Nodes: Negative. No mediastinal hematoma or lymphadenopathy. Lungs/Pleura: Small pneumothorax (series 5, image 78). In the posterior right upper lobe a 3 cm area of pulmonary contusion and possible mild pulmonary laceration is noted on series 5, image 55. Less likely, this could be contusion superimposed on chronic emphysema. Superimposed symmetric dependent atelectasis in both lungs. No hemothorax or pleural fluid identified. Major airways are patent. Upper Abdomen: Negative visible liver, gallbladder, spleen, pancreas, adrenal glands, kidneys and bowel in the upper abdomen. Musculoskeletal: Intact sternum. Thoracic vertebrae appear intact. Visible left shoulder osseous structures and left ribs appear intact. Comminuted and mildly displaced fracture through the body of the right scapula (series 5, image 47). Normal glenohumeral alignment. Other visible right shoulder osseous structures are intact. Minimally displaced posterior right 4th rib fracture on series 5, image 41. This is underlying the scapula fracture. No other right rib fracture. IMPRESSION: 1. Small right pneumothorax. Mild posterior right upper lobe pulmonary contusion with small adjacent lung laceration versus underlying emphysema. No hemothorax, pleural fluid. 2. Minimally displaced posterior right 4th rib fracture underlying a comminuted and mildly displaced fracture through the body of the right scapula. 3. No other acute traumatic injury identified in the chest. Electronically Signed   By: Odessa Fleming M.D.   On: 03/17/2021 07:02   DG Chest Port 1 View  Result Date: 03/18/2021 CLINICAL DATA:  Chest pain after fall. EXAM: PORTABLE CHEST 1 VIEW COMPARISON:  March 17, 2021. FINDINGS: The heart size and mediastinal contours are within normal limits. Left lung is clear. Small right apical pneumothorax is noted which is slightly decreased compared to prior exam. The visualized skeletal structures are unremarkable.  IMPRESSION: Slightly improved small right apical pneumothorax. Electronically Signed   By: Lupita Raider M.D.   On: 03/18/2021 08:52   DG Humerus Right  Addendum Date: 03/17/2021   ADDENDUM REPORT: 03/17/2021 04:30 ADDENDUM: These results were called by telephone at the time of interpretation on 03/17/2021 at 4:27 am to provider [facility] , who verbally acknowledged these results. Electronically Signed   By: Tish Frederickson M.D.   On: 03/17/2021 04:30   Result Date: 03/17/2021 CLINICAL DATA:  Right upper arm pain and mild central chest pain. Intoxicated status post wrestling status post fall EXAM: RIGHT HUMERUS - 2+ VIEW; STERNUM - 2+ VIEW COMPARISON:  None. FINDINGS: Right humerus: Likely right scapular fracture. No other acute displaced fracture of the bones of the right shoulder. No right shoulder dislocation. No acute displaced fracture of the right humerus. Visualized portions of the right elbow are unremarkable. Minimally displaced fracture of the posterior right fourth rib. The heart size and mediastinal contours are within normal limits. No focal consolidation. No pulmonary edema. No pleural effusion. Trace to small volume right pneumothorax. No left pneumothorax. Soft tissues are unremarkable. IMPRESSION: 1. Likely right scapular fracture. 2. Minimally displaced fracture of the posterior right fourth rib. 3. Associated trace to small volume right pneumothorax. 4. No right humeral fracture. 5. Limited evaluation for acute sternal fracture. Electronically Signed: By: Tish Frederickson  M.D. On: 03/17/2021 04:24    Anti-infectives: Anti-infectives (From admission, onward)    None        Assessment/Plan Wrestling R 4th rib fx with PTX and pulmonary contusion - slight improvement in R apical PTX on CXR this AM, supplemental O2, IS, pulm toilet; repeat CXR in AM Comminuted R scapula fx - per ortho, sling for comfort and follow up in 2 weeks  EtOH abuse - CIWA protocol   FEN: reg diet,  SLIV VTE: lovenox ID: no abx indicated  Dispo: PT/OT. Repeat CXR in AM  LOS: 1 day    Juliet Rude, [facility] Surgery 03/18/2021, 10:25 AM Please see Amion for pager number during day hours 7:00am-4:30pm

## 2021-03-18 NOTE — Progress Notes (Signed)
Blood Pressure 153/105.  On-Call provider paged.  No new orders given. Increased Blood Pressure medication parameters not meet at this time.  Pt. Is resting comfortably in bed.

## 2021-03-19 ENCOUNTER — Inpatient Hospital Stay (HOSPITAL_COMMUNITY): Payer: Self-pay

## 2021-03-19 MED ORDER — METHOCARBAMOL 500 MG PO TABS: 500.0000 mg | ORAL_TABLET | Freq: Three times a day (TID) | ORAL | 0 refills | Status: DC

## 2021-03-19 MED ORDER — AMLODIPINE BESYLATE 5 MG PO TABS: 5.0000 mg | ORAL_TABLET | Freq: Every day | ORAL | 0 refills | Status: DC

## 2021-03-19 MED ORDER — AMLODIPINE BESYLATE 5 MG PO TABS
5.0000 mg | ORAL_TABLET | Freq: Every day | ORAL | Status: DC
  Administered 2021-03-19: 5 mg via ORAL
  Filled 2021-03-19: qty 1

## 2021-03-19 MED ORDER — IBUPROFEN 600 MG PO TABS: 600.0000 mg | ORAL_TABLET | Freq: Four times a day (QID) | ORAL | 0 refills | Status: DC | PRN

## 2021-03-19 MED ORDER — POTASSIUM CHLORIDE CRYS ER 20 MEQ PO TBCR
40.0000 meq | EXTENDED_RELEASE_TABLET | Freq: Two times a day (BID) | ORAL | Status: DC
  Administered 2021-03-19: 40 meq via ORAL
  Filled 2021-03-19: qty 2

## 2021-03-19 MED ORDER — ACETAMINOPHEN 500 MG PO TABS: 1000.0000 mg | ORAL_TABLET | Freq: Four times a day (QID) | ORAL | Status: DC | PRN

## 2021-03-19 MED ORDER — OXYCODONE HCL 5 MG PO TABS: 5.0000 mg | ORAL_TABLET | Freq: Four times a day (QID) | ORAL | 0 refills | Status: DC | PRN

## 2021-03-19 NOTE — Progress Notes (Signed)
Physical Therapy Evaluation Patient Details Name: Blake Bishop MRN: [id number] DOB: 1976/03/21 Today's Date: 03/19/2021   History of Present Illness  Pt admit 7/2 after wrestling with family resulting in R 4th rib fx with PTX and pulmonary contusion.  Right scapular fx with sling.  +ETOH.  PMH: unremarkable  Clinical Impression  Pt admitted with above diagnosis. Pt was able to ambulate with min guard assist to supervision doing better with cane for stability. Pt should progress well.   Pt currently with functional limitations due to the deficits listed below (see PT Problem List). Pt will benefit from skilled PT to increase their independence and safety with mobility to allow discharge to the venue listed below.       Follow Up Recommendations No PT follow up    Equipment Recommendations  Cane    Recommendations for Other Services       Precautions / Restrictions Precautions Precautions: Fall Required Braces or Orthoses: Sling (for comfort) Restrictions Weight Bearing Restrictions: Yes RUE Weight Bearing: Non weight bearing Other Position/Activity Restrictions: Follow up with MD in 2 weeks      Mobility  Bed Mobility Overal bed mobility: Independent                  Transfers Overall transfer level: Independent                  Ambulation/Gait Ambulation/Gait assistance: Supervision;Min guard Gait Distance (Feet): 150 Feet Assistive device: Straight cane Gait Pattern/deviations: Step-through pattern;Decreased stride length   Gait velocity interpretation: <1.8 ft/sec, indicate of risk for recurrent falls General Gait Details: Pt was able to ambulate with cane with steady gait overall.  Pt did not need cues for sequencing and did perform better with cane then without.  Can withstand challenges to balance.  Stairs            Wheelchair Mobility    Modified Rankin (Stroke Patients Only)       Balance Overall balance assessment: No apparent  balance deficits (not formally assessed)                                           Pertinent Vitals/Pain Pain Assessment: Faces Faces Pain Scale: Hurts even more Pain Location: right UE and back Pain Descriptors / Indicators: Aching;Grimacing;Guarding Pain Intervention(s): Limited activity within patient's tolerance;Monitored during session;Repositioned    Home Living Family/patient expects to be discharged to:: Private residence Living Arrangements: Other relatives (aunt who works) Available Help at Discharge: Available PRN/intermittently Type of Home: House Home Access: Stairs to enter   Secretary/administrator of Steps: 2 Home Layout: One level Home Equipment: None      Prior Function Level of Independence: Independent         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     Hand Dominance   Dominant Hand: Right    Extremity/Trunk Assessment   Upper Extremity Assessment Upper Extremity Assessment: Defer to OT evaluation    Lower Extremity Assessment Lower Extremity Assessment: Generalized weakness    Cervical / Trunk Assessment Cervical / Trunk Assessment: Normal  Communication   Communication: No difficulties  Cognition Arousal/Alertness: Awake/alert Behavior During Therapy: WFL for tasks assessed/performed Overall Cognitive Status: Within Functional Limits for tasks assessed  General Comments General comments (skin integrity, edema, etc.): sling in place right UE    Exercises     Assessment/Plan    PT Assessment Patient needs continued PT services  PT Problem List Decreased mobility;Decreased balance;Decreased activity tolerance;Decreased knowledge of use of DME;Decreased safety awareness       PT Treatment Interventions DME instruction;Gait training;Functional mobility training;Therapeutic activities;Therapeutic exercise;Patient/family education;Stair training    PT Goals  (Current goals can be found in the Care Plan section)  Acute Rehab PT Goals Patient Stated Goal: to go home PT Goal Formulation: With patient Time For Goal Achievement: 03/26/21 Potential to Achieve Goals: Good    Frequency Min 5X/week   Barriers to discharge        Co-evaluation               AM-PAC PT "6 Clicks" Mobility  Outcome Measure Help needed turning from your back to your side while in a flat bed without using bedrails?: None Help needed moving from lying on your back to sitting on the side of a flat bed without using bedrails?: None Help needed moving to and from a bed to a chair (including a wheelchair)?: None Help needed standing up from a chair using your arms (e.g., wheelchair or bedside chair)?: A Little Help needed to walk in hospital room?: A Little Help needed climbing 3-5 steps with a railing? : A Little 6 Click Score: 21    End of Session Equipment Utilized During Treatment: Gait belt Activity Tolerance: Patient tolerated treatment well Patient left: in chair;with call bell/phone within reach Nurse Communication: Mobility status PT Visit Diagnosis: Muscle weakness (generalized) (M62.81)    Time: 6384-6659 PT Time Calculation (min) (ACUTE ONLY): 20 min   Charges:   PT Evaluation $PT Eval Low Complexity: 1 Low          Blake Bishop M,PT Acute Rehab Services [phone] (pager)   Blake Bishop 03/19/2021, 11:43 AM

## 2021-03-19 NOTE — Discharge Summary (Signed)
Physician Discharge Summary  Patient ID: Blake Bishop MRN: [id number] DOB/AGE: [birth year] 45 y.o.  Admit date: 03/17/2021 Discharge date: 03/19/2021  Discharge Diagnoses Wrestling  Right 4th rib fracture Right pneumothorax and pulmonary contusion  Comminuted right scapula fracture Alcohol abuse HTN  Consultants Orthopedic surgery   Procedures None   HPI: Patient is an 45 y.o. male who reported no prior medical history and presented to [facility] ED overnight for evaluation. He reported that he was horse playing with a family member earlier in the evening on 03/16/2021 and was taken down while wrestling. He developed right upper back/chest wall pain and therefore presented to the emergency room for further evaluation.  He denied any loss of consciousness. Workup in the ED revealed above listed injuries.   Hospital Course: Patient was admitted to the trauma service. Serial CXR showed significant improvement in right pneumothorax. Orthopedic surgery was consulted for right scapula fracture and recommended sling and outpatient follow up. Patient noted to be hypertensive during hospitalization and started on amlodipine. He was instructed to see a primary care provider to further assess this and make any medication changes for HTN. Patient was evaluated by PT who cleared. On 03/19/21 patient was discharged home in stable condition with follow up as outlined below.   I or a member of my team have reviewed this patient in the Controlled Substance Database   Allergies as of 03/19/2021   Not on File      Medication List     TAKE these medications    acetaminophen 500 MG tablet Commonly known as: TYLENOL Take 2 tablets (1,000 mg total) by mouth every 6 (six) hours as needed for mild pain or fever.   amLODipine 5 MG tablet Commonly known as: NORVASC Take 1 tablet (5 mg total) by mouth daily.   ibuprofen 600 MG tablet Commonly known as: ADVIL Take 1 tablet (600 mg total) by mouth every 6 (six)  hours as needed for mild pain (pain not controlled with tylenol).   methocarbamol 500 MG tablet Commonly known as: ROBAXIN Take 1 tablet (500 mg total) by mouth 3 (three) times daily.   oxyCODONE 5 MG immediate release tablet Commonly known as: Oxy IR/ROXICODONE Take 1 tablet (5 mg total) by mouth every 6 (six) hours as needed (pain not controlled with tylenol and ibuprofen first).          Follow-up Information     Yolonda Kida, MD. Schedule an appointment as soon as possible for a visit in 2 week(s).   Specialty: Orthopedic Surgery Contact information: [address] [place] [address] [phone]                 Signed: Juliet Rude , [facility] Surgery 03/19/2021, 11:13 AM Please see Amion for pager number during day hours 7:00am-4:30pm

## 2021-03-19 NOTE — Progress Notes (Signed)
Progress Note     Subjective: Patient reports pain is well controlled. He has been up and moving around independently already. Denies SOB, pulling 2000 on IS. He smoked previously but states he is quitting. He would like to possibly go home today.   Objective: Vital signs in last 24 hours: Temp:  [98.6 F (37 C)-98.9 F (37.2 C)] 98.7 F (37.1 C) (07/04 0624) Pulse Rate:  [66-83] 66 (07/04 0624) Resp:  [16-18] 18 (07/03 1944) BP: (141-160)/(103-122) 156/107 (07/04 0624) SpO2:  [100 %] 100 % (07/04 0624) Last BM Date: 03/16/21  Intake/Output from previous day: 07/03 0701 - 07/04 0700 In: 2318.4 [P.O.:1140; I.V.:1178.4] Out: 900 [Urine:900] Intake/Output this shift: Total I/O In: 180 [P.O.:180] Out: 300 [Urine:300]  PE: General: pleasant, WD, WN male who is laying in bed in NAD Heart: regular, rate, and rhythm.  Normal s1,s2. No obvious murmurs, gallops, or rubs noted.  Palpable radial and pedal pulses bilaterally Lungs: CTAB, no wheezes, rhonchi, or rales noted.  Respiratory effort nonlabored Abd: soft, NT, ND, +BS, no masses, hernias, or organomegaly MS: RUE in sling, R hand NVI   Lab Results:  Recent Labs    03/17/21 0436  WBC 12.1*  HGB 15.1  HCT 45.1  PLT 195   BMET Recent Labs    03/17/21 0436 03/18/21 0035  NA 141 136  K 3.5 2.9*  CL 104 103  CO2 25 25  GLUCOSE 120* 106*  BUN 7 7  CREATININE 0.85 0.86  CALCIUM 8.9 8.7*   PT/INR No results for input(s): LABPROT, INR in the last 72 hours. CMP     Component Value Date/Time   NA 136 03/18/2021 0035   K 2.9 (L) 03/18/2021 0035   CL 103 03/18/2021 0035   CO2 25 03/18/2021 0035   GLUCOSE 106 (H) 03/18/2021 0035   BUN 7 03/18/2021 0035   CREATININE 0.86 03/18/2021 0035   CALCIUM 8.7 (L) 03/18/2021 0035   PROT 6.6 03/18/2021 0035   ALBUMIN 3.4 (L) 03/18/2021 0035   AST 28 03/18/2021 0035   ALT 30 03/18/2021 0035   ALKPHOS 49 03/18/2021 0035   BILITOT 1.1 03/18/2021 0035   GFRNONAA >60  03/18/2021 0035   Lipase  No results found for: LIPASE     Studies/Results: DG CHEST PORT 1 VIEW  Result Date: 03/19/2021 CLINICAL DATA:  Follow-up for right pneumothorax. EXAM: PORTABLE CHEST 1 VIEW COMPARISON:  03/18/2021 and older studies. FINDINGS: Small right pneumothorax has further decreased in size, now minimal. Lungs are clear.  No pleural effusion or left pneumothorax. Heart, mediastinum and hila are unremarkable. IMPRESSION: 1. Minimal residual right pneumothorax, decreased in size from the previous day's exam. Electronically Signed   By: Amie Portland M.D.   On: 03/19/2021 07:42   DG Chest Port 1 View  Result Date: 03/18/2021 CLINICAL DATA:  Chest pain after fall. EXAM: PORTABLE CHEST 1 VIEW COMPARISON:  March 17, 2021. FINDINGS: The heart size and mediastinal contours are within normal limits. Left lung is clear. Small right apical pneumothorax is noted which is slightly decreased compared to prior exam. The visualized skeletal structures are unremarkable. IMPRESSION: Slightly improved small right apical pneumothorax. Electronically Signed   By: Lupita Raider M.D.   On: 03/18/2021 08:52    Anti-infectives: Anti-infectives (From admission, onward)    None        Assessment/Plan Wrestling R 4th rib fx with PTX and pulmonary contusion - CXR this AM with trace apical PTX but improving still Comminuted  R scapula fx - per ortho, sling for comfort and follow up in 2 weeks EtOH abuse - CIWA protocol HTN - amlodopine 5 mg, will need PCP referral for further management   FEN: reg diet, SLIV VTE: lovenox ID: no abx indicated   Dispo: PT/OT. Possible discharge later today, will discuss with MD  LOS: 2 days    Juliet Rude, [facility] Surgery 03/19/2021, 9:14 AM Please see Amion for pager number during day hours 7:00am-4:30pm

## 2022-03-02 IMAGING — DX DG CHEST 1V PORT
1 series · 1 of 1 positions shown · non-contrast
Comparison: March 17, 2021.

CLINICAL DATA: Chest pain after fall.

EXAM:
PORTABLE CHEST 1 VIEW

[chest ap]
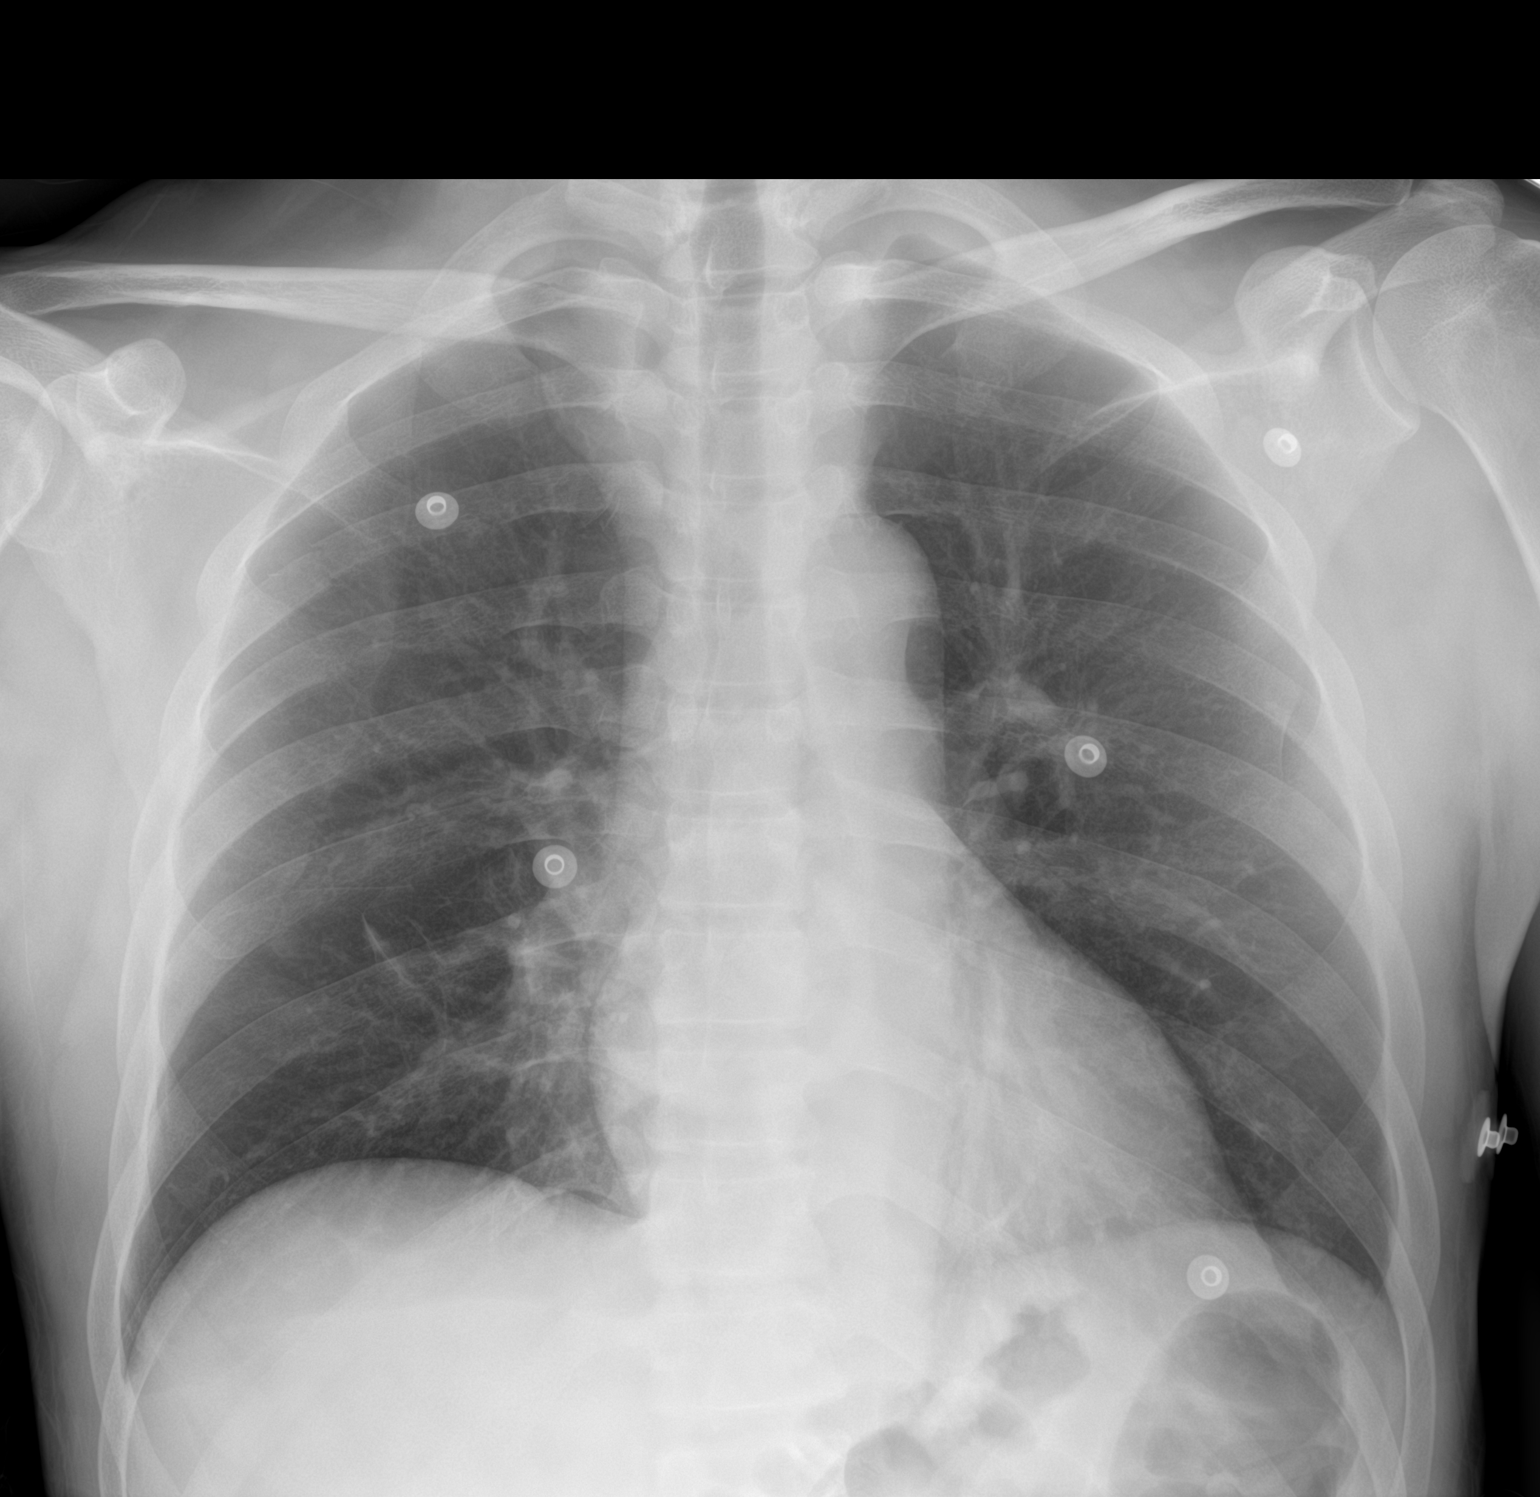

[1 of 1 positions shown; findings below may reference images not displayed]

FINDINGS: The heart size and mediastinal contours are within normal limits.
Left lung is clear. Small right apical pneumothorax is noted which
is slightly decreased compared to prior exam. The visualized
skeletal structures are unremarkable.
IMPRESSION: Slightly improved small right apical pneumothorax.

## 2022-09-17 ENCOUNTER — Emergency Department (HOSPITAL_COMMUNITY)
Admission: EM | Admit: 2022-09-17 | Discharge: 2022-09-17 | Disposition: A | Discharge status: Home / Self Care | Payer: Self-pay | Attending: Emergency Medicine | Admitting: Emergency Medicine

## 2022-09-17 DIAGNOSIS — H6122 Impacted cerumen, left ear: Secondary | ICD-10-CM | POA: Insufficient documentation

## 2022-09-17 MED ORDER — CARBAMIDE PEROXIDE 6.5 % OT SOLN: 5.0000 [drp] | Freq: Two times a day (BID) | OTIC | 0 refills | Status: DC

## 2022-09-17 NOTE — ED Triage Notes (Signed)
Patient here with complaint of bilateral otalgia and diminished hearing starting one week ago. Patient states he has tried instilling peroxide with no relief of symptoms. Denies drainage.

## 2022-09-17 NOTE — ED Provider Triage Note (Signed)
Emergency Medicine Provider Triage Evaluation Note  Blake Bishop , a 47 y.o. male  was evaluated in triage.  Pt complains of bilateral ear discomfort, decreased hearing for 1 week.  No fever, bleeding or drainage.  Using peroxide without relief.  No ear trauma.  No runny nose, sore throat, cough or fever.  Review of Systems  Positive: Otalgia, decreased hearing Negative: Fever, cough, chest pain, shortness of breath  Physical Exam  BP (!) 156/126 (BP Location: Left Arm)   Pulse 84   Temp 98 F (36.7 C) (Oral)   Resp 18   SpO2 99%  Gen:   Awake, no distress   Resp:  Normal effort  MSK:   Moves extremities without difficulty  Other:  Cerumen impaction on left, TM not visible.  Right TM is erythematous.  No tragus or mastoid pain bilaterally  Medical Decision Making  Medically screening exam initiated at 9:00 AM.  Appropriate orders placed.  Blake Bishop was informed that the remainder of the evaluation will be completed by another provider, this initial triage assessment does not replace that evaluation, and the importance of remaining in the ED until their evaluation is complete.     Blake Essex, MD 09/17/22 [phone]

## 2022-09-17 NOTE — ED Provider Notes (Signed)
[facility] EMERGENCY DEPARTMENT Provider Note   CSN: [id number] Arrival date & time: 09/17/22  0820     History  Chief Complaint  Patient presents with   Otalgia    Blake Bishop is a 47 y.o. male.   Otalgia    Patient presents with left ear pain and decreased hearing x 1 week.  Feels having pain to the right ear but decreased hearing as the left.  He tried hydrogen peroxide with no improvement.  Is not having any fevers or chills, he does have some pain to the ears bilaterally.  No nasal congestion.  Home Medications Prior to Admission medications   Medication Sig Start Date End Date Taking? Authorizing Provider  carbamide peroxide (DEBROX) 6.5 % OTIC solution Place 5 drops into the left ear 2 (two) times daily. 09/17/22  Yes Sherrill Raring, PA-C  acetaminophen (TYLENOL) 500 MG tablet Take 2 tablets (1,000 mg total) by mouth every 6 (six) hours as needed for mild pain or fever. 03/19/21   Norm Parcel, PA-C  amLODipine (NORVASC) 5 MG tablet Take 1 tablet (5 mg total) by mouth daily. 03/19/21   Norm Parcel, PA-C  ibuprofen (ADVIL) 600 MG tablet Take 1 tablet (600 mg total) by mouth every 6 (six) hours as needed for mild pain (pain not controlled with tylenol). 03/19/21   Norm Parcel, PA-C  methocarbamol (ROBAXIN) 500 MG tablet Take 1 tablet (500 mg total) by mouth 3 (three) times daily. 03/19/21   Norm Parcel, PA-C  oxyCODONE (OXY IR/ROXICODONE) 5 MG immediate release tablet Take 1 tablet (5 mg total) by mouth every 6 (six) hours as needed (pain not controlled with tylenol and ibuprofen first). 03/19/21   Norm Parcel, PA-C      Allergies    Patient has no known allergies.    Review of Systems   Review of Systems  HENT:  Positive for ear pain.     Physical Exam Updated Vital Signs BP (!) 156/126 (BP Location: Left Arm)   Pulse 84   Temp 98 F (36.7 C) (Oral)   Resp 18   SpO2 99%  Physical Exam Vitals and nursing note reviewed. Exam  conducted with a chaperone present.  Constitutional:      General: He is not in acute distress.    Appearance: Normal appearance.  HENT:     Head: Normocephalic and atraumatic.     Ears:     Comments: No erythema or mastoiditis to the auricle.  No tragal tenderness.  Cerumen impaction left, some mild erythema to the right TM but no bulging, debris. Eyes:     General: No scleral icterus.    Extraocular Movements: Extraocular movements intact.     Pupils: Pupils are equal, round, and reactive to light.  Skin:    Coloration: Skin is not jaundiced.  Neurological:     Mental Status: He is alert. Mental status is at baseline.     Coordination: Coordination normal.     ED Results / Procedures / Treatments   Labs (all labs ordered are listed, but only abnormal results are displayed) Labs Reviewed  CBG MONITORING, ED    EKG None  Radiology No results found.  Procedures Procedures    Medications Ordered in ED Medications - No data to display  ED Course/ Medical Decision Making/ A&P  Medical Decision Making Risk OTC drugs.   Patient presents due to otalgia.  On exam he has a cerumen impaction to the left ear which think is the etiology for his decreased hearing.  No signs and excising otitis externa, he is afebrile and I doubt AOM.  Will have patient try Debrox drops at home for resolution.  Stable for discharge at this time.        Final Clinical Impression(s) / ED Diagnoses Final diagnoses:  Impacted cerumen of left ear    Rx / DC Orders ED Discharge Orders          Ordered    carbamide peroxide (DEBROX) 6.5 % OTIC solution  2 times daily        09/17/22 0948              Sherrill Raring, PA-C 09/17/22 2703    Malvin Johns, MD 09/17/22 1034

## 2022-09-17 NOTE — Discharge Instructions (Addendum)
Use the debrox drops as prescribed to left ear to resolve impaction. Follow up with primary or urgent care if no resolution.

## 2022-09-23 ENCOUNTER — Emergency Department (HOSPITAL_COMMUNITY): Admission: EM | Admit: 2022-09-23 | Discharge: 2022-09-23 | Discharge status: Against Medical Advice | Payer: Self-pay

## 2022-09-23 NOTE — ED Notes (Signed)
The patient did not answer for triage 

## 2022-09-23 NOTE — ED Notes (Signed)
Pt called for vitals; no response.  

## 2022-10-30 ENCOUNTER — Ambulatory Visit
Admission: EM | Admit: 2022-10-30 | Discharge: 2022-10-30 | Disposition: A | Discharge status: Home / Self Care | Payer: Self-pay | Attending: Internal Medicine | Admitting: Internal Medicine

## 2022-10-30 DIAGNOSIS — Z1152 Encounter for screening for COVID-19: Secondary | ICD-10-CM | POA: Insufficient documentation

## 2022-10-30 DIAGNOSIS — F17210 Nicotine dependence, cigarettes, uncomplicated: Secondary | ICD-10-CM | POA: Insufficient documentation

## 2022-10-30 DIAGNOSIS — J069 Acute upper respiratory infection, unspecified: Secondary | ICD-10-CM | POA: Insufficient documentation

## 2022-10-30 DIAGNOSIS — R059 Cough, unspecified: Secondary | ICD-10-CM | POA: Insufficient documentation

## 2022-10-30 NOTE — ED Triage Notes (Signed)
Pt c/o cough, congestion, chills, and fatigue since yesterday. States took OTC meds and helped with the fever.

## 2022-10-30 NOTE — ED Provider Notes (Signed)
[facility]    CSN: [id number] Arrival date & time: 10/30/22  1744      History   Chief Complaint Chief Complaint  Patient presents with   Cough    HPI URIJAH BITTING is a 47 y.o. male.   Patient presents with cough, nasal congestion, chills, fatigue that started yesterday.  Patient has taken over-the-counter cold and flu medication with some improvement in symptoms.  Reports family members have similar symptoms.  Patient reports that he has felt feverish but did not take temperature with thermometer.  Denies history of asthma or COPD.  Patient reports that he smokes cigarettes "occasionally".  Denies chest pain or shortness of breath.   Cough   No past medical history on file.  Patient Active Problem List   Diagnosis Date Noted   Rib fractures 03/17/2021    No past surgical history on file.     Home Medications    Prior to Admission medications   Not on File    Family History No family history on file.  Social History Social History   Tobacco Use   Smoking status: Every Day    Packs/day: 1.00    Types: Cigarettes   Smokeless tobacco: Never  Substance Use Topics   Alcohol use: Yes    Comment: every other day   Drug use: No     Allergies   Patient has no known allergies.   Review of Systems Review of Systems Per HPI  Physical Exam Triage Vital Signs ED Triage Vitals  Enc Vitals Group     BP 10/30/22 1905 (!) 144/98     Pulse Rate 10/30/22 1905 97     Resp 10/30/22 1905 20     Temp 10/30/22 1905 98.6 F (37 C)     Temp Source 10/30/22 1905 Oral     SpO2 10/30/22 1905 97 %     Weight --      Height --      Head Circumference --      Peak Flow --      Pain Score 10/30/22 1909 0     Pain Loc --      Pain Edu? --      Excl. in [place]? --    No data found.  Updated Vital Signs BP (!) 144/98 (BP Location: Left Arm)   Pulse 97   Temp 98.6 F (37 C) (Oral)   Resp 20   SpO2 97%   Visual Acuity Right Eye Distance:    Left Eye Distance:   Bilateral Distance:    Right Eye Near:   Left Eye Near:    Bilateral Near:     Physical Exam Constitutional:      General: He is not in acute distress.    Appearance: Normal appearance. He is not toxic-appearing or diaphoretic.  HENT:     Head: Normocephalic and atraumatic.     Right Ear: Tympanic membrane and ear canal normal.     Left Ear: Tympanic membrane and ear canal normal.     Nose: Congestion present.     Mouth/Throat:     Mouth: Mucous membranes are moist.     Pharynx: No posterior oropharyngeal erythema.  Eyes:     Extraocular Movements: Extraocular movements intact.     Conjunctiva/sclera: Conjunctivae normal.     Pupils: Pupils are equal, round, and reactive to light.  Cardiovascular:     Rate and Rhythm: Normal rate and regular rhythm.     Pulses:  Normal pulses.     Heart sounds: Normal heart sounds.  Pulmonary:     Effort: Pulmonary effort is normal. No respiratory distress.     Breath sounds: Normal breath sounds. No stridor. No wheezing, rhonchi or rales.  Abdominal:     General: Abdomen is flat. Bowel sounds are normal.     Palpations: Abdomen is soft.  Musculoskeletal:        General: Normal range of motion.     Cervical back: Normal range of motion.  Skin:    General: Skin is warm and dry.  Neurological:     General: No focal deficit present.     Mental Status: He is alert and oriented to person, place, and time. Mental status is at baseline.  Psychiatric:        Mood and Affect: Mood normal.        Behavior: Behavior normal.      UC Treatments / Results  Labs (all labs ordered are listed, but only abnormal results are displayed) Labs Reviewed  SARS CORONAVIRUS 2 (TAT 6-24 HRS)    EKG   Radiology No results found.  Procedures Procedures (including critical care time)  Medications Ordered in UC Medications - No data to display  Initial Impression / Assessment and Plan / UC Course  I have reviewed the triage  vital signs and the nursing notes.  Pertinent labs & imaging results that were available during my care of the patient were reviewed by me and considered in my medical decision making (see chart for details).     Patient presents with symptoms likely from a viral upper respiratory infection. Do not suspect underlying cardiopulmonary process. Symptoms seem unlikely related to ACS, CHF or COPD exacerbations, pneumonia, pneumothorax. Patient is nontoxic appearing and not in need of emergent medical intervention.  COVID test pending.  Recommended symptom control with over the counter medications.  Return if symptoms fail to improve in 1-2 weeks or you develop shortness of breath, chest pain, severe headache. Patient states understanding and is agreeable.  Discharged with PCP followup.  Final Clinical Impressions(s) / UC Diagnoses   Final diagnoses:  Viral upper respiratory tract infection with cough     Discharge Instructions      It appears that you have a viral illness that should run its course and self resolve with the help of symptomatic treatment.  COVID test is pending.  We will call if it is abnormal.    ED Prescriptions   None    PDMP not reviewed this encounter.   Teodora Medici, [place] 10/30/22 1920

## 2022-10-30 NOTE — Discharge Instructions (Signed)
It appears that you have a viral illness that should run its course and self resolve with the help of symptomatic treatment.  COVID test is pending.  We will call if it is abnormal.

## 2022-10-31 LAB — SARS CORONAVIRUS 2 (TAT 6-24 HRS): SARS Coronavirus 2: NEGATIVE
